# Patient Record
Sex: Male | Born: 1995 | Race: Black or African American | Hispanic: No | Marital: Single | Smoking: Current every day smoker
Health system: Southern US, Community
[De-identification: ages and names within clinical notes are randomized; demographics above are authoritative.]

## PROBLEM LIST (undated history)

## (undated) DIAGNOSIS — Z21 Asymptomatic human immunodeficiency virus [HIV] infection status: Secondary | ICD-10-CM

## (undated) DIAGNOSIS — B20 Human immunodeficiency virus [HIV] disease: Secondary | ICD-10-CM

## (undated) HISTORY — PX: NO PAST SURGERIES: SHX2092

---
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
## 2017-01-09 ENCOUNTER — Ambulatory Visit

## 2017-02-08 ENCOUNTER — Emergency Department (HOSPITAL_COMMUNITY)
Admission: EM | Admit: 2017-02-08 | Discharge: 2017-02-09 | Disposition: A | Discharge status: Home / Self Care | Attending: Emergency Medicine | Admitting: Emergency Medicine

## 2017-02-08 ENCOUNTER — Encounter (HOSPITAL_COMMUNITY): Payer: Self-pay | Admitting: Emergency Medicine

## 2017-02-08 DIAGNOSIS — Y929 Unspecified place or not applicable: Secondary | ICD-10-CM | POA: Insufficient documentation

## 2017-02-08 DIAGNOSIS — F17290 Nicotine dependence, other tobacco product, uncomplicated: Secondary | ICD-10-CM | POA: Diagnosis not present

## 2017-02-08 DIAGNOSIS — Z23 Encounter for immunization: Secondary | ICD-10-CM | POA: Diagnosis not present

## 2017-02-08 DIAGNOSIS — Y999 Unspecified external cause status: Secondary | ICD-10-CM | POA: Diagnosis not present

## 2017-02-08 DIAGNOSIS — S01511A Laceration without foreign body of lip, initial encounter: Secondary | ICD-10-CM | POA: Insufficient documentation

## 2017-02-08 DIAGNOSIS — Y939 Activity, unspecified: Secondary | ICD-10-CM | POA: Insufficient documentation

## 2017-02-08 DIAGNOSIS — R220 Localized swelling, mass and lump, head: Secondary | ICD-10-CM

## 2017-02-08 MED ORDER — TETANUS-DIPHTH-ACELL PERTUSSIS 5-2.5-18.5 LF-MCG/0.5 IM SUSP
0.5000 mL | Freq: Once | INTRAMUSCULAR | Status: AC
Start: 2017-02-09 — End: 2017-02-09
  Administered 2017-02-09: 0.5 mL via INTRAMUSCULAR
  Filled 2017-02-08: qty 0.5
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
## 2017-02-20 ENCOUNTER — Ambulatory Visit (INDEPENDENT_AMBULATORY_CARE_PROVIDER_SITE_OTHER): Admitting: Family Medicine

## 2017-02-20 DIAGNOSIS — B351 Tinea unguium: Secondary | ICD-10-CM

## 2017-02-20 MED ORDER — TERBINAFINE HCL 250 MG PO TABS: 250.0000 mg | ORAL_TABLET | Freq: Every day | ORAL | 0 refills | Status: DC

## 2017-02-20 NOTE — Assessment & Plan Note (Addendum)
Patient is here with signs and symptoms consistent with onychomycoses of the fifth toe bilaterally. No pain at this time however some social detriment noted. - Terbinafine 12 weeks. - I have asked patient to contact his infectious disease doctor to ensure he is okay initiating this medication. Patient stated his understanding. - Patient to follow-up in 2 months if symptoms are not mildly improved.  Next: If patient's infectious disease physician does not approve of 12 week course of terbinafine then strong consideration for topical antifungal.

## 2017-02-20 NOTE — Patient Instructions (Signed)
It was a pleasure seeing you today in our clinic. Today we discussed your toenails. Here is the treatment plan we have discussed and agreed upon together:   The condition is called "onychomycosis" - I prescribed you terbinafine. Take 1 tablet daily for the next 12 weeks. - Be mindful of your footwear prior to your exercises; wearing dry socks and dry shoes are very important. After each workout remove both socks and shoes to allow to air dry. If you are exercising away from home I would strongly recommend bringing a second per shoes or sandals to wear and change into after the workout. - You should notice some mild changes in approximately 2 months; if you notice any worsening come back and see [place].

## 2017-02-20 NOTE — Progress Notes (Signed)
   HPI  CC: Toenail changes  Patient is here to discuss abnormalities in his toenails. He states that the nails of his fifth toes, bilaterally, have been brittle and discolored "since I can remember". He states that neither of these toes are painful. He denies any injury or trauma to these nails. He denies any malodorous scent. He states that these nails are much more brittle than the others. Patient states that although he has no pain or discomfort from these changes, they make him insecure and uncomfortable when exposed to public.  Patient states that he runs for exercise frequently.  ROS: Denies fever, chills, numbness, weakness, or paresthesias.  Objective: BP 106/74   Pulse 72   Temp 98.8 F (37.1 C) (Oral)   Wt 141 lb (64 kg)   BMI 22.76 kg/m  Gen: NAD, alert, cooperative. CV: Well-perfused. Resp: Non-labored. Neuro: Sensation intact throughout. Feet, Bilateral: No evidence of erythema, edema, or bony abnormality. Nailbeds of digit 1 through 4 normal bilaterally. Fifth digit nail bed notably brittle, scalene, hyperpigmented (bilaterally).   Assessment and plan:  Onychomycosis Patient is here with signs and symptoms consistent with onychomycoses of the fifth toe bilaterally. No pain at this time however some social detriment noted. - Terbinafine 12 weeks. - I have asked patient to contact his infectious disease doctor to ensure he is okay initiating this medication. Patient stated his understanding. - Patient to follow-up in 2 months if symptoms are not mildly improved.  Next: If patient's infectious disease physician does not approve of 12 week course of terbinafine then strong consideration for topical antifungal.   Meds ordered this encounter  Medications  . terbinafine (LAMISIL) 250 MG tablet    Sig: Take 1 tablet (250 mg total) by mouth daily.    Dispense:  90 tablet    Refill:  0     Kathee Delton, MD,MS,  PGY3 02/20/2017 5:19 PM

## 2017-05-04 ENCOUNTER — Encounter: Payer: Self-pay | Admitting: Internal Medicine

## 2017-05-09 ENCOUNTER — Telehealth: Payer: Self-pay | Admitting: Pharmacist Clinician (PhC)/ Clinical Pharmacy Specialist

## 2017-05-09 NOTE — Telephone Encounter (Signed)
Called Jeff Daniel to schedule an appt for labs before the visit with Dr. Orvan Falconerampbell in July after email to Dr. Orvan Falconerampbell. He will come in next week.

## 2017-05-11 ENCOUNTER — Ambulatory Visit

## 2017-05-15 ENCOUNTER — Other Ambulatory Visit

## 2017-05-15 DIAGNOSIS — B20 Human immunodeficiency virus [HIV] disease: Secondary | ICD-10-CM

## 2017-05-15 LAB — CBC
HCT: 43.9 % (ref 36.0–49.0)
Hemoglobin: 14.7 g/dL (ref 12.0–16.9)
MCH: 31.2 pg (ref 25.0–35.0)
MCHC: 33.5 g/dL (ref 31.0–36.0)
MCV: 93.2 fL (ref 78.0–98.0)
MPV: 9.9 fL (ref 7.5–12.5)
Platelets: 265 10*3/uL (ref 140–400)
RBC: 4.71 MIL/uL (ref 4.10–5.70)
RDW: 12.7 % (ref 11.0–15.0)
WBC: 6 10*3/uL (ref 4.5–13.0)

## 2017-05-15 LAB — COMPREHENSIVE METABOLIC PANEL
ALT: 29 U/L (ref 8–46)
AST: 20 U/L (ref 12–32)
Albumin: 4.1 g/dL (ref 3.6–5.1)
Alkaline Phosphatase: 78 U/L (ref 48–230)
BUN: 6 mg/dL — AB (ref 7–20)
CHLORIDE: 104 mmol/L (ref 98–110)
CO2: 26 mmol/L (ref 20–31)
CREATININE: 0.86 mg/dL (ref 0.60–1.26)
Calcium: 9.2 mg/dL (ref 8.9–10.4)
Glucose, Bld: 89 mg/dL (ref 65–99)
Potassium: 4.1 mmol/L (ref 3.8–5.1)
SODIUM: 140 mmol/L (ref 135–146)
Total Bilirubin: 0.5 mg/dL (ref 0.2–1.1)
Total Protein: 6.9 g/dL (ref 6.3–8.2)

## 2017-05-16 LAB — T-HELPER CELL (CD4) - (RCID CLINIC ONLY)
CD4 % Helper T Cell: 28 % — ABNORMAL LOW (ref 33–55)
CD4 T Cell Abs: 750 /uL (ref 400–2700)

## 2017-05-17 LAB — HIV-1 RNA QUANT-NO REFLEX-BLD
HIV 1 RNA Quant: <20 copies/mL — AB
HIV-1 RNA QUANT, LOG: DETECTED {Log_copies}/mL — AB

## 2017-05-24 ENCOUNTER — Ambulatory Visit (INDEPENDENT_AMBULATORY_CARE_PROVIDER_SITE_OTHER): Admitting: Internal Medicine

## 2017-05-24 DIAGNOSIS — B20 Human immunodeficiency virus [HIV] disease: Secondary | ICD-10-CM

## 2017-05-24 NOTE — Progress Notes (Signed)
Patient Active Problem List   Diagnosis Date Noted  . 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 HIV (human immunodeficiency virus infection) (HCC) 05/04/2016    Priority: High  . Depression 07/12/2016    Priority: Medium  . Outbursts of anger 07/12/2016    Priority: Medium  . Onychomycosis 02/20/2017  . Nephrolithiasis 08/22/2016  . Constipation 05/04/2016  . Chronic cough 04/29/2016  . High risk sexual behavior 04/28/2016  . Seasonal allergies 03/25/2015    Patient's Medications  New Prescriptions   No medications on file  Previous Medications   ABACAVIR-DOLUTEGRAVIR-LAMIVUDINE (TRIUMEQ) 600-50-300 MG TABLET    Take 1 tablet by mouth daily.   TERBINAFINE (LAMISIL) 250 MG TABLET    Take 1 tablet (250 mg total) by mouth daily.  Modified Medications   No medications on file  Discontinued Medications   No medications on file    Subjective: Jeff Daniel is in for his routine HIV follow-up visit. He has had no problems obtaining, taking or tolerating his Triumeq. He takes it each day around noon. He estimates that he has missed 2 doses in the past month when he was not home to take it. He is currently working at [name] but will return to school in the fall. He is currently not in a relationship and not sexually active.  Review of Systems: Review of Systems  Constitutional: Negative for chills, diaphoresis, fever, malaise/fatigue and weight loss.  HENT: Negative for sore throat.   Respiratory: Negative for cough, sputum production and shortness of breath.   Cardiovascular: Negative for chest pain.  Gastrointestinal: Negative for abdominal pain, diarrhea, heartburn, nausea and vomiting.  Genitourinary: Negative for dysuria and frequency.  Musculoskeletal: Negative for joint pain and myalgias.  Skin: Negative for rash.  Neurological: Negative for dizziness and headaches.  Psychiatric/Behavioral: Negative for depression and substance  abuse. The patient is not nervous/anxious.     Past Medical History:  Diagnosis Date  . HIV (human immunodeficiency virus infection) (HCC)     Social History  Substance Use Topics  . Smoking status: Current Every Day Smoker    Types: Cigars  . Smokeless tobacco: Never Used  . Alcohol use Yes    Family History  Problem Relation Age of Onset  . Hypertension Mother   . Hypertension Maternal Grandmother   . Diabetes Maternal Grandmother   . Hypertension Maternal Grandfather   . Diabetes Maternal Grandfather   . Hypertension Paternal Grandmother   . Diabetes Paternal Grandmother   . Hypertension Paternal Grandfather   . Diabetes Paternal Grandfather     No Known Allergies  Objective:  Vitals:   05/24/17 1549  BP: 121/71  Pulse: (!) 53  Temp: 98 F (36.7 C)  SpO2: 99%   There is no height or weight on file to calculate BMI.  Physical Exam  Constitutional: He is oriented to person, place, and time.  HENT:  Mouth/Throat: No oropharyngeal exudate.  Eyes: Conjunctivae are normal.  Cardiovascular: Normal rate and regular rhythm.   No murmur heard. Pulmonary/Chest: Effort normal and breath sounds normal.  Abdominal: Soft. He exhibits no mass. There is no tenderness.  Musculoskeletal: Normal range of motion.  Neurological: He is alert and oriented to person, place, and time.  Skin: No rash noted.  Psychiatric: Mood and affect normal.    Lab Results Lab Results  Component Value Date   WBC 6.0 05/15/2017   HGB 14.7 05/15/2017  HCT 43.9 05/15/2017   MCV 93.2 05/15/2017   PLT 265 05/15/2017    Lab Results  Component Value Date   CREATININE 0.86 05/15/2017   BUN 6 (L) 05/15/2017   NA 140 05/15/2017   K 4.1 05/15/2017   CL 104 05/15/2017   CO2 26 05/15/2017    Lab Results  Component Value Date   ALT 29 05/15/2017   AST 20 05/15/2017   ALKPHOS 78 05/15/2017   BILITOT 0.5 05/15/2017    Lab Results  Component Value Date   CHOL 124 12/22/2016   HDL 58  12/22/2016   LDLCALC 57 12/22/2016   TRIG 45 12/22/2016   CHOLHDL 2.1 12/22/2016   Lab Results  Component Value Date   LABRPR NON REAC 12/22/2016   HIV 1 RNA Quant (copies/mL)  Date Value  05/15/2017 <20 DETECTED (A)  12/22/2016 59 (H)  07/12/2016 137 (H)   CD4 T Cell Abs (/uL)  Date Value  05/15/2017 750  12/22/2016 520  07/12/2016 420     Problem List Items Addressed This Visit      High   HIV (human immunodeficiency virus infection) (HCC)    His infection has come under excellent control over the past year. I talked to him about adherence tips and gave him a small pill canisters to use. He will continue Triumeq and follow-up here after blood work in 6 months.      Relevant Orders   T-helper cell (CD4)- ([facility] clinic only)   HIV 1 RNA quant-no reflex-bld   CBC   Comprehensive metabolic panel   Lipid panel   RPR        [name], MD [facility] [phone] pager   [phone] cell 05/24/2017, 3:59 PM

## 2017-05-24 NOTE — Assessment & Plan Note (Signed)
His infection has come under excellent control over the past year. I talked to him about adherence tips and gave him a small pill canisters to use. He will continue Triumeq and follow-up here after blood work in 6 months.

## 2017-06-19 ENCOUNTER — Other Ambulatory Visit: Payer: Self-pay | Admitting: Internal Medicine

## 2017-06-19 DIAGNOSIS — B20 Human immunodeficiency virus [HIV] disease: Secondary | ICD-10-CM

## 2017-08-07 IMAGING — CR DG CHEST 2V
2 series · 2 of 2 positions shown · non-contrast
Comparison: None.

CLINICAL DATA: Productive cough for 2 months, fever.

EXAM:
CHEST  2 VIEW

[w chest pa]
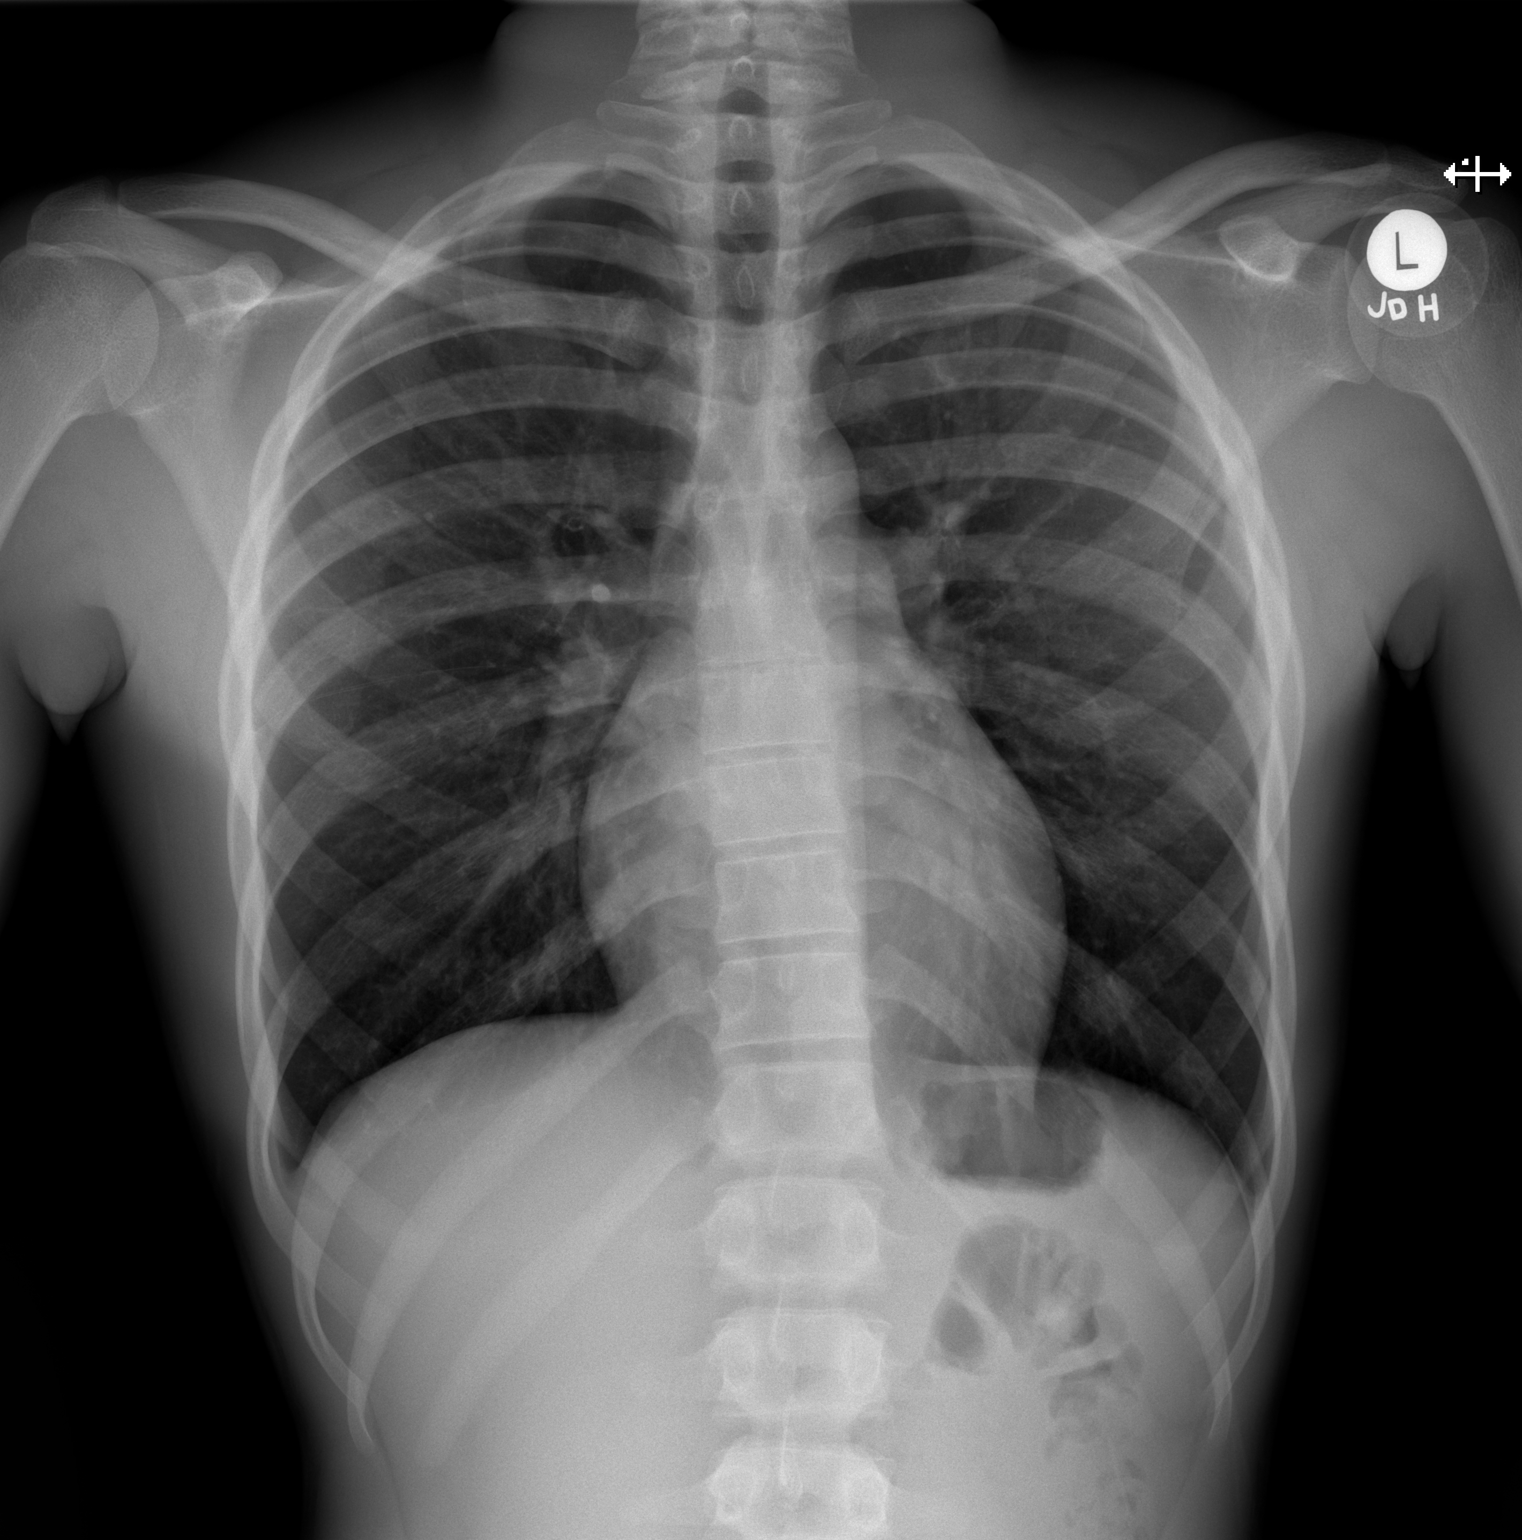

[w chest lat]
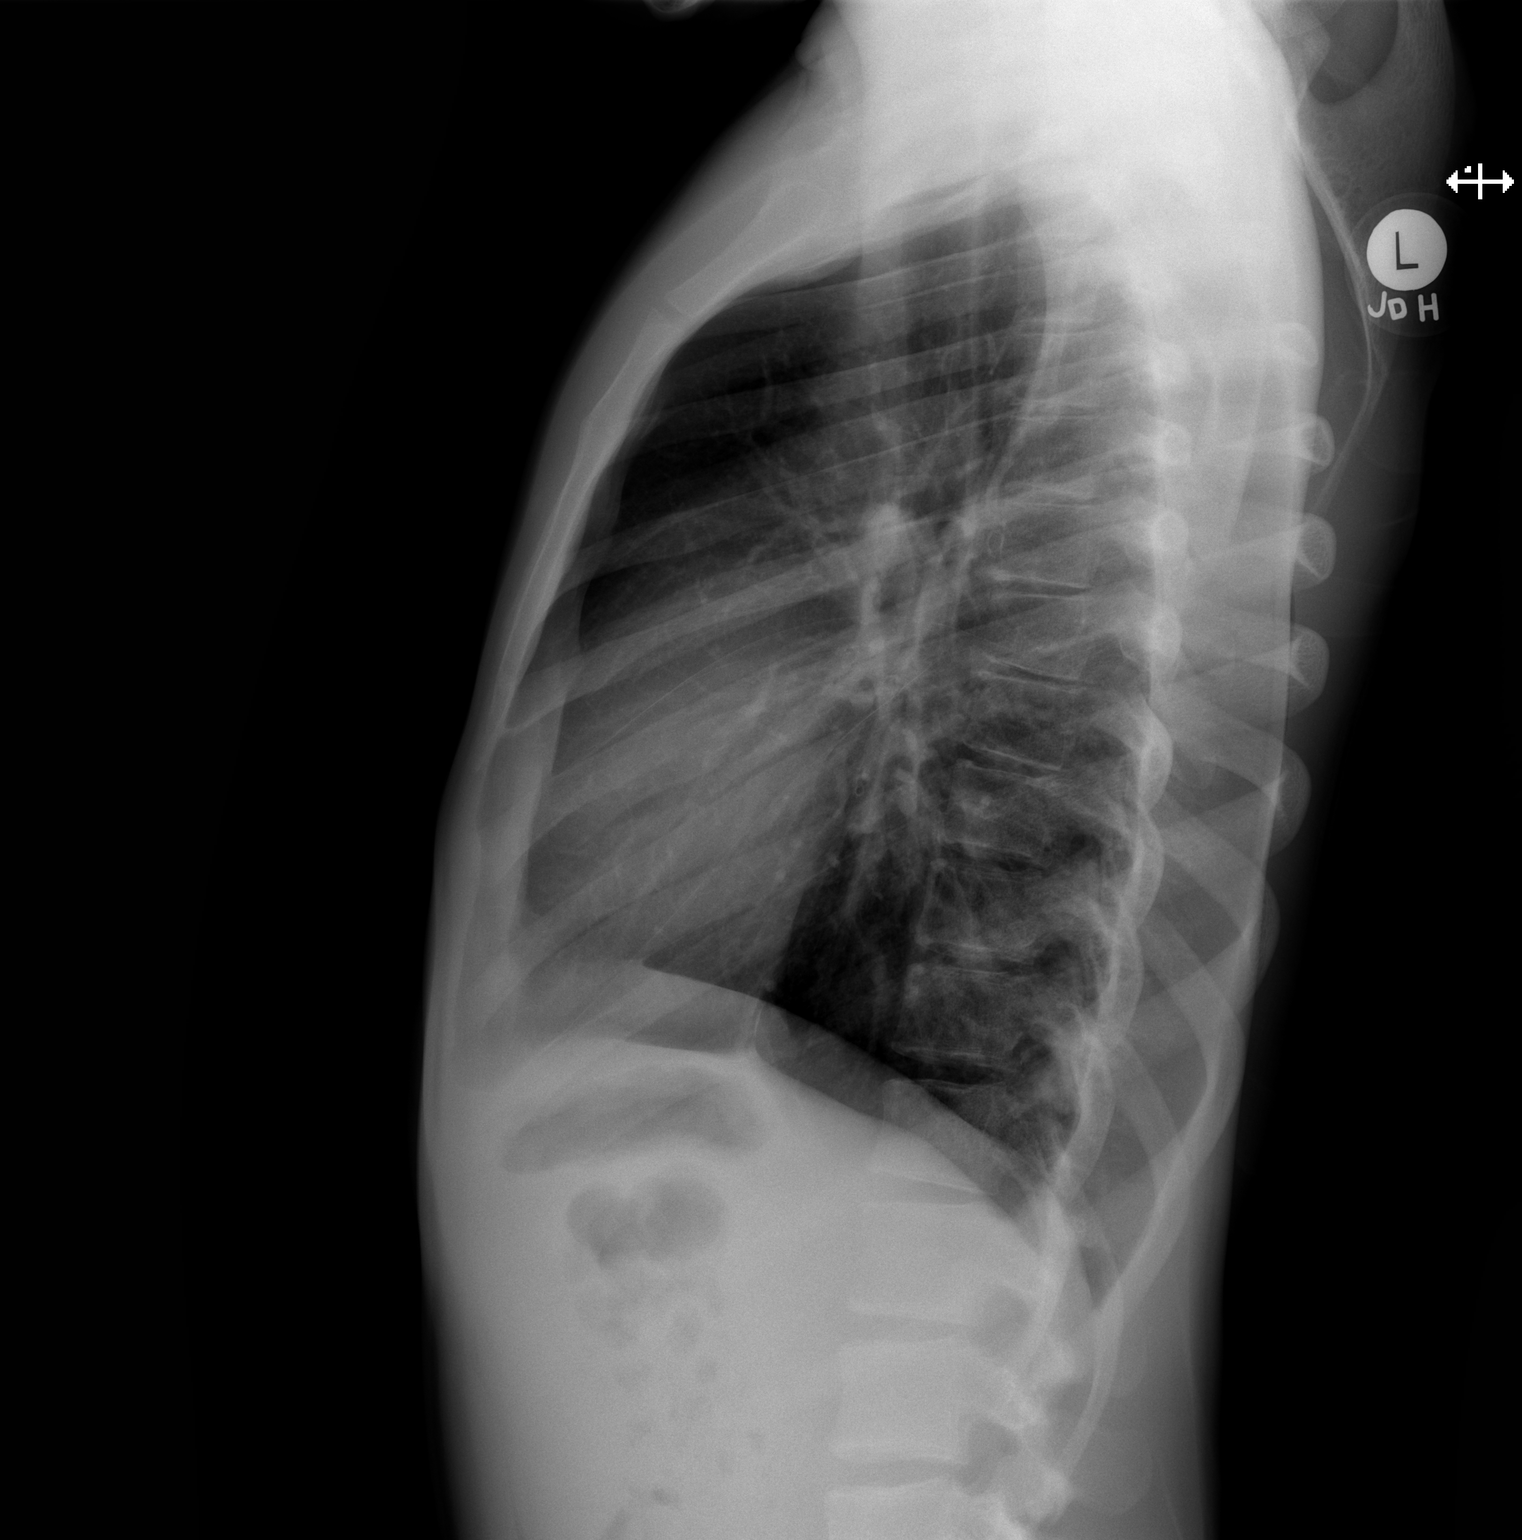

[2 of 2 positions shown; findings below may reference images not displayed]

FINDINGS: The heart size and mediastinal contours are within normal limits.
Both lungs are clear. The visualized skeletal structures are
unremarkable.
IMPRESSION: No active cardiopulmonary disease.

## 2017-09-15 ENCOUNTER — Ambulatory Visit (INDEPENDENT_AMBULATORY_CARE_PROVIDER_SITE_OTHER): Payer: Self-pay | Admitting: Family Medicine

## 2017-09-15 VITALS — BP 102/60 | HR 72 | Temp 97.9°F | Wt 143.0 lb

## 2017-09-15 DIAGNOSIS — B351 Tinea unguium: Secondary | ICD-10-CM

## 2017-09-15 NOTE — Patient Instructions (Signed)
Fungal Nail Infection Fungal nail infection is a common fungal infection of the toenails or fingernails. This condition affects toenails more often than fingernails. More than one nail may be infected. The condition can be passed from person to person (is contagious). What are the causes? This condition is caused by a fungus. Several types of funguses can cause the infection. These funguses are common in moist and warm areas. If your hands or feet come into contact with the fungus, it may get into a crack in your fingernail or toenail and cause the infection. What increases the risk? The following factors may make you more likely to develop this condition:  Being male.  Having diabetes.  Being of older age.  Living with someone who has the fungus.  Walking barefoot in areas where the fungus thrives, such as showers or locker rooms.  Having poor circulation.  Wearing shoes and socks that cause your feet to sweat.  Having athlete's foot.  Having a nail injury or history of a recent nail surgery.  Having psoriasis.  Having a weak body defense system (immune system).  What are the signs or symptoms? Symptoms of this condition include:  A pale spot on the nail.  Thickening of the nail.  A nail that becomes yellow or brown.  A brittle or ragged nail edge.  A crumbling nail.  A nail that has lifted away from the nail bed.  How is this diagnosed? This condition is diagnosed with a physical exam. Your health care provider may take a scraping or clipping from your nail to test for the fungus. How is this treated? Mild infections do not need treatment. If you have significant nail changes, treatment may include:  Oral antifungal medicines. You may need to take the medicine for several weeks or several months, and you may not see the results for a long time. These medicines can cause side effects. Ask your health care provider what problems to watch for.  Antifungal nail polish  and nail cream. These may be used along with oral antifungal medicines.  Laser treatment of the nail.  Surgery to remove the nail. This may be needed for the most severe infections.  Treatment takes a long time, and the infection may come back. Follow these instructions at home: Medicines  Take or apply over-the-counter and prescription medicines only as told by your health care provider.  Ask your health care provider about using over-the-counter mentholated ointment on your nails. Lifestyle   Do not share personal items, such as towels or nail clippers.  Trim your nails often.  Wash and dry your hands and feet every day.  Wear absorbent socks, and change your socks frequently.  Wear shoes that allow air to circulate, such as sandals or canvas tennis shoes. Throw out old shoes.  Wear rubber gloves if you are working with your hands in wet areas.  Do not walk barefoot in shower rooms or locker rooms.  Do not use a nail salon that does not use clean instruments.  Do not use artificial nails. General instructions  Keep all follow-up visits as told by your health care provider. This is important.  Use antifungal foot powder on your feet and in your shoes. Contact a health care provider if: Your infection is not getting better or it is getting worse after several months. This information is not intended to replace advice given to you by your health care provider. Make sure you discuss any questions you have with your health   care provider. Document Released: 10/14/2000 Document Revised: 03/24/2016 Document Reviewed: 04/20/2015 Elsevier Interactive Patient Education  [employer]2018 Elsevier Inc[employer]   It was a pleasure meeting you today.   Today we discussed your fungal nail infection.  For your infection: I would like to check your liver function labs before prescribing you medicine. I will check them today and if the results are normal I will call in a prescription for terbinafine to  take for 12 weeks. For the big toe you may use over the counter Lotrimin cream at the top of your nail as well.   If I start you on Terbinafine you will need to come back in 1 month for repeat labs to make sure your liver function stays normal.   Please follow up in 1 month for labs and as needed for follow up of the toe or sooner if symptoms persist or worsen. Please call the clinic immediately if you have concerns.   Our clinic's number is [phone]. Please call with questions or concerns.   Thank you,  Oralia ManisSherin Cydney Alvarenga, DO

## 2017-09-15 NOTE — Assessment & Plan Note (Signed)
Patient today with signs and symptoms consistent with onychomycosis of fifth toe bilaterally and of first toe on right food. No pain, itching, or odor noted.  -will obtain LFTs today to determine liver function. If normal, will prescribe terbinafine x 12 weeks. Will obtain repeat LFT's 1 month after initiation of terbinafine  -may use OTC lotrimin to distal portion of nail on first digit of 5th toe -follow up as needed if symptoms do not improve or worsen and for repeat labs

## 2017-09-15 NOTE — Progress Notes (Signed)
   Subjective:    Patient ID: Jeff Daniel, male    DOB: 1996-05-15, 21 y.o.   MRN: [id number]   CC: toe fungus   HPI: Toe fungus  Patient today with complaints of toe fungus for "as long as I can remember". States he was seen in clinic in April for this problem and was prescribed terbinafine x12 weeks. Toenails improved but never fully cleared up. Patient denies pain, odor, or itching. States toenails are not bothering him but does not like the appearance. Patient notices nails are "chipping" and are very hard. 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 Patient has a history of HIV but is taking his medications daily and is being followed by ID.   Objective:  BP 102/60   Pulse 72   Temp 97.9 F (36.6 C) (Oral)   Wt 143 lb (64.9 kg)   SpO2 98%   BMI 23.08 kg/m  Vitals and nursing note reviewed  General: well nourished, in no acute distress Cardiac: RRR, clear S1 and S2, no murmurs, rubs, or gallops Respiratory: clear to auscultation bilaterally, no increased work of breathing Abdomen: soft, nontender, nondistended, no masses or organomegaly. Bowel sounds present Extremities: no edema or cyanosis. Warm, well perfused. 2+ radial and PT pulses bilaterally Feet: no erythema, edema, or bony abnormality, well perfused. Nailbeds of digit 5 of right foot and digit 5 of left foot notably brittle, scalene, and hyperpigmented. Distal edge of nailbed of digit 1 on right foot showing increased scaling.  Skin: warm and dry, no rashes noted Neuro: alert and oriented, no focal deficits  Assessment & Plan:   Onychomycosis Patient today with signs and symptoms consistent with onychomycosis of fifth toe bilaterally and of first toe on right food. No pain, itching, or odor noted.  -will obtain LFTs today to determine liver function. If normal, will prescribe terbinafine x 12 weeks. Will obtain repeat LFT's 1 month after initiation of terbinafine  -may  use OTC lotrimin to distal portion of nail on first digit of 5th toe -follow up as needed if symptoms do not improve or worsen and for repeat labs   Return if symptoms worsen or fail to improve.   Oralia ManisSherin Ethelbert Thain, DO, PGY-1

## 2017-09-16 LAB — HEPATIC FUNCTION PANEL
ALBUMIN: 4.4 g/dL (ref 3.5–5.5)
ALT: 37 IU/L (ref 0–44)
AST: 81 IU/L — AB (ref 0–40)
Alkaline Phosphatase: 73 IU/L (ref 39–117)
BILIRUBIN TOTAL: 0.3 mg/dL (ref 0.0–1.2)
Bilirubin, Direct: 0.1 mg/dL (ref 0.00–0.40)
TOTAL PROTEIN: 7.2 g/dL (ref 6.0–8.5)

## 2017-09-18 ENCOUNTER — Telehealth: Payer: Self-pay | Admitting: Family Medicine

## 2017-09-18 NOTE — Telephone Encounter (Signed)
Patient with elevated AST of 81. Previous AST levels wnl. Discussed results with patient, no concerns at this time. Informed patient to call if questions arise. Recommend that patient obtain fungal culture and to tailor treatment based on culture results. Will not prescribe terbinafine at this time given elevated AST.  -patient verbalized understanding of plan and stated he will make follow up appointment to obtain fungal culture -discussed plan with preceptor, Dr. Clista BernhardtEniola   Koda Routon, DO, PGY-1 [facility] 09/18/2017 11:38 AM

## 2017-10-06 ENCOUNTER — Ambulatory Visit: Payer: Self-pay | Admitting: Family Medicine

## 2017-11-05 IMAGING — CT CT RENAL STONE PROTOCOL
2 of 4 series · 16 of 46 positions shown, 18 images · non-contrast
Comparison: None.

CLINICAL DATA: Right flank pain since midnight last night.

EXAM:
CT ABDOMEN AND PELVIS WITHOUT CONTRAST
TECHNIQUE: Multidetector CT imaging of the abdomen and pelvis was performed
following the standard protocol without IV contrast.

[Series 2: renal stone 5mm · axial · 0.65mm/px · z∈[+830,+1210]mm · 13 of 84 slices shown, 15 images]
[im 4/84  soft-tissue]
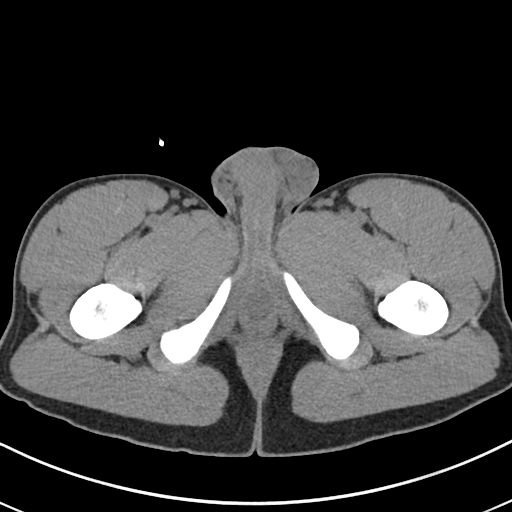
[im 4/84  bone]
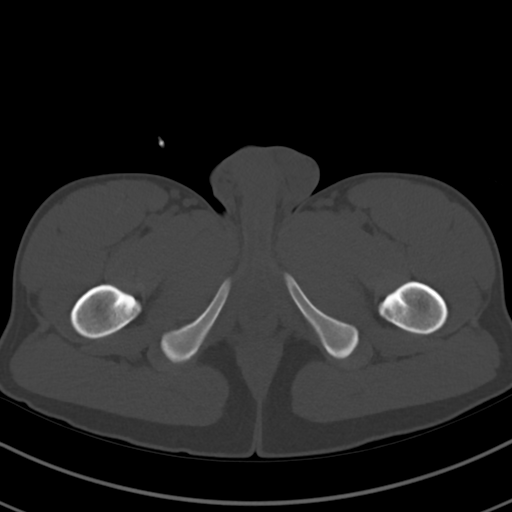
[im 10/84  soft-tissue]
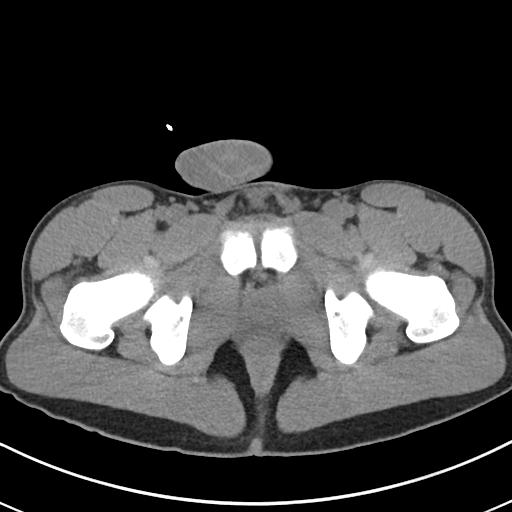
[im 16/84  soft-tissue]
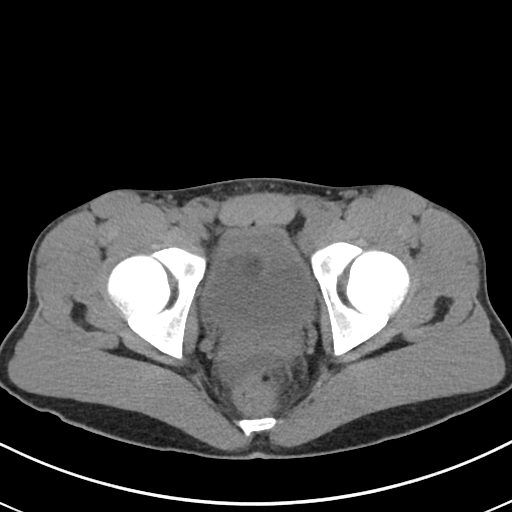
[im 23/84  soft-tissue]
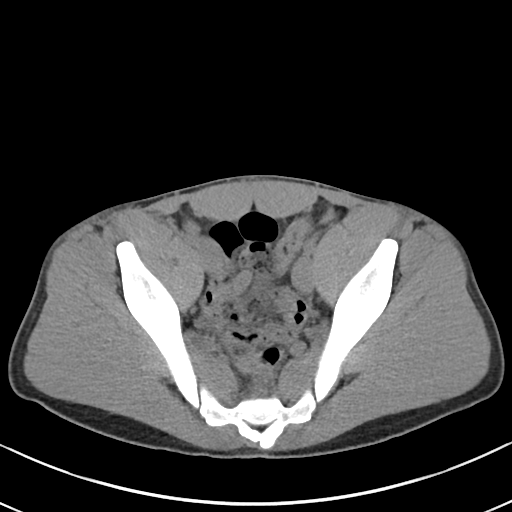
[im 29/84  soft-tissue]
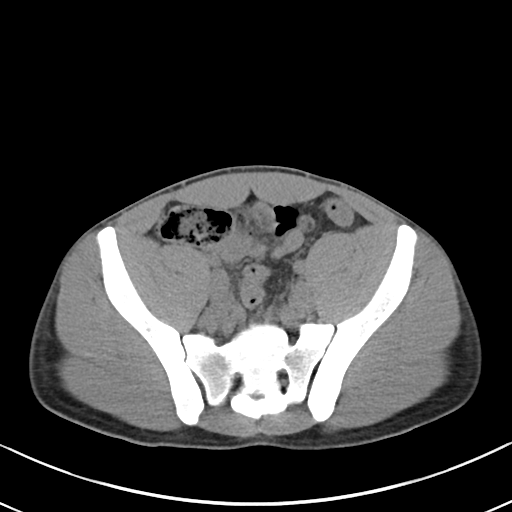
[im 36/84  soft-tissue]
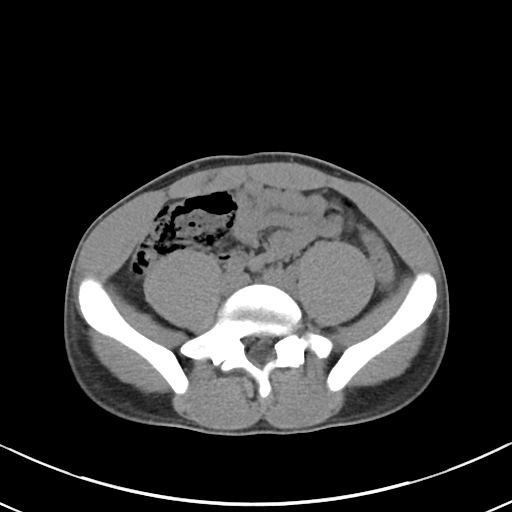
[im 42/84  soft-tissue]
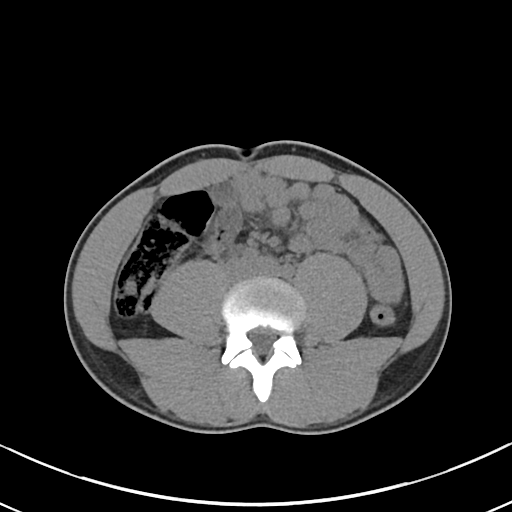
[im 48/84  soft-tissue]
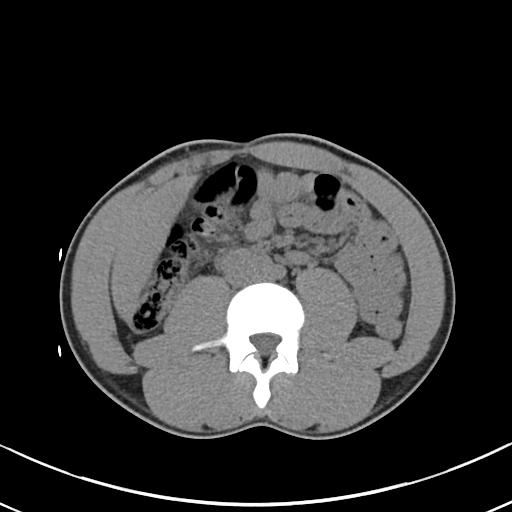
[im 55/84  soft-tissue]
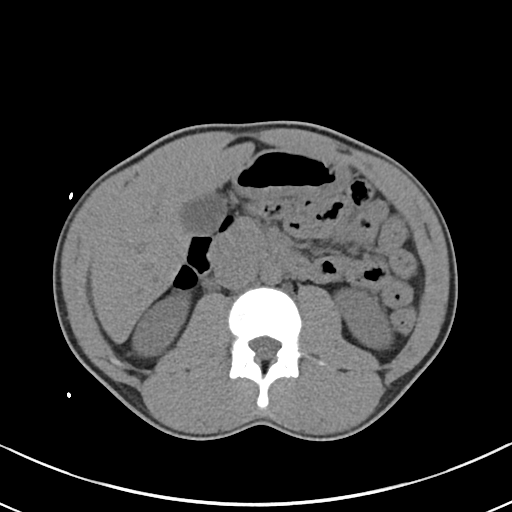
[im 55/84  bone]
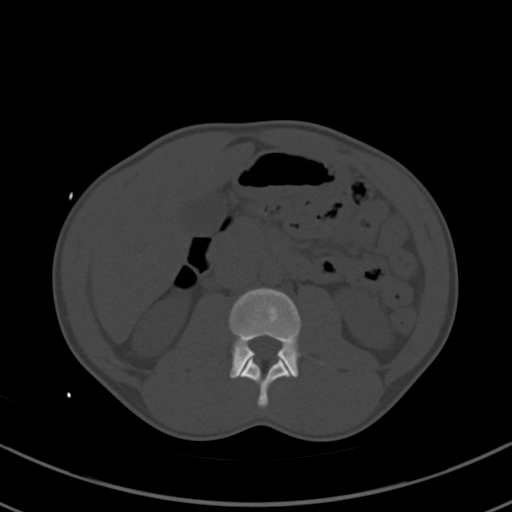
[im 61/84  soft-tissue]
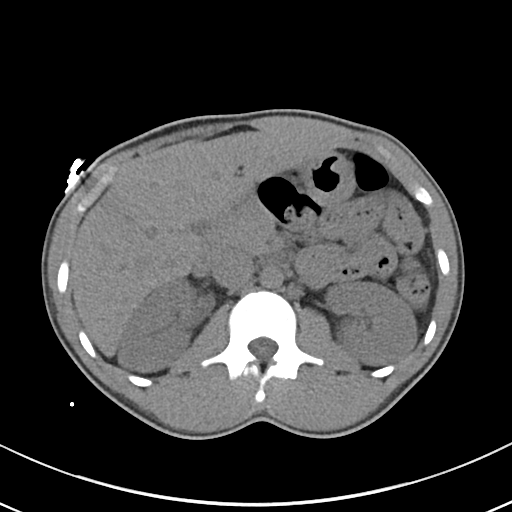
[im 68/84  soft-tissue]
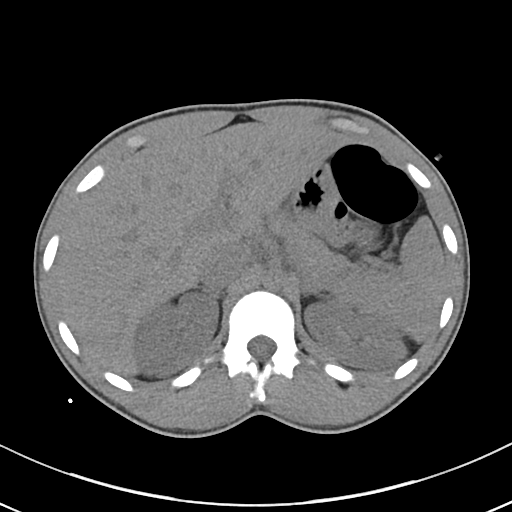
[im 74/84  soft-tissue]
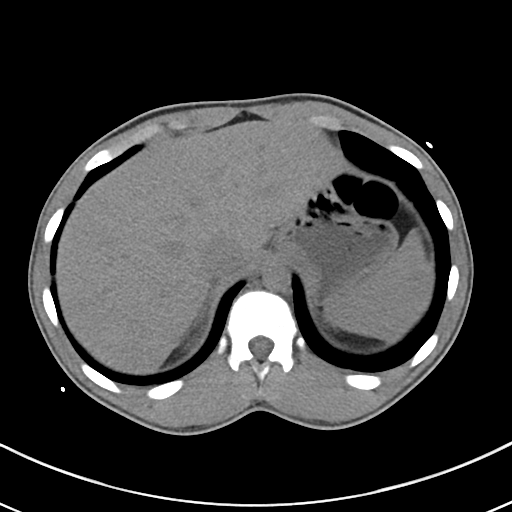
[im 80/84  soft-tissue]
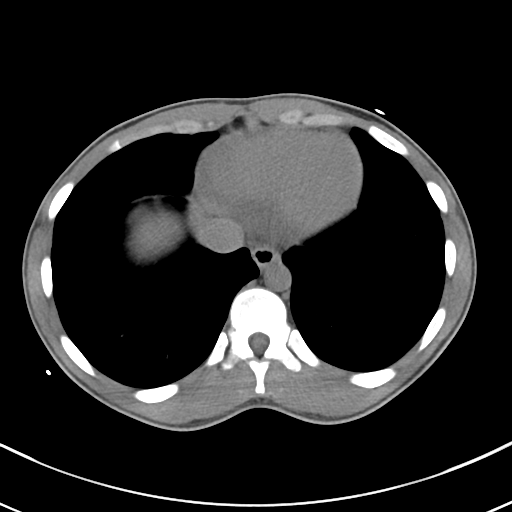

[Series 5: renal stone 3.0 cor · coronal · 0.64mm/px · 3 of 75 slices shown]
[im 25/75  soft-tissue]
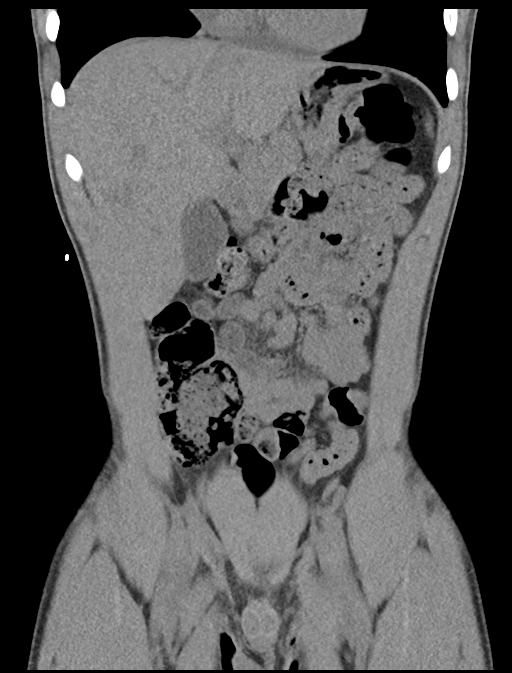
[im 33/75  soft-tissue]
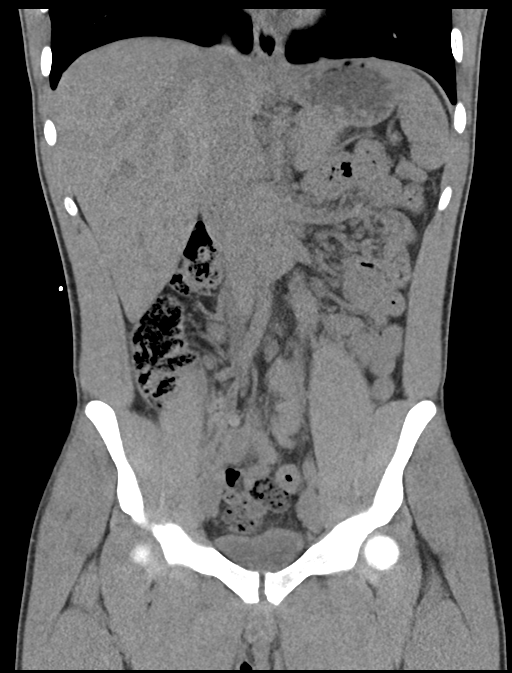
[im 42/75  soft-tissue]
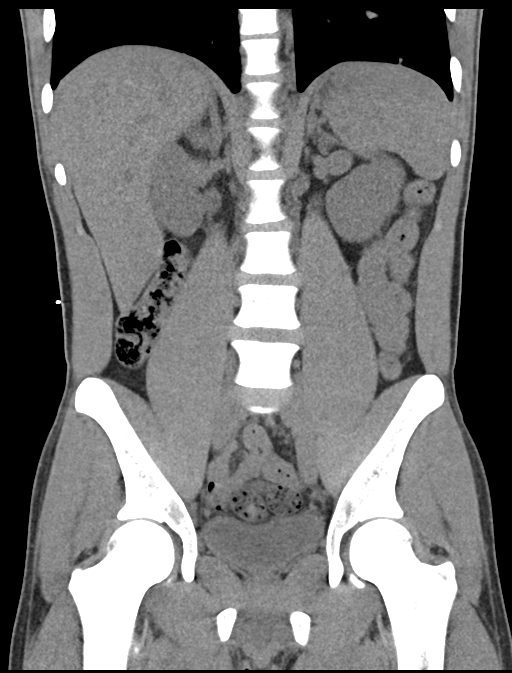

[16 of 46 positions shown; findings below may reference images not displayed]

FINDINGS: Lower chest: The lung bases are clear of acute process. No pleural
effusion or pulmonary lesions. The heart is normal in size. No
pericardial effusion. The distal esophagus and aorta are
unremarkable.

Hepatobiliary: No focal hepatic lesions or intrahepatic biliary
dilatation. The gallbladder is normal. No common bile duct
dilatation.

Pancreas: No mass, inflammation or ductal dilatation.

Spleen: Normal size.  No focal lesions.

Adrenals/Urinary Tract: The adrenal glands are normal.

Mild right-sided hydroureteronephrosis with a 1-2 mm calculus at the
right UVJ. No left-sided ureteral calculi. No bladder calculi.

Small lower pole right renal calculus is noted. The medullary
pyramids appear slightly dense and I suspect the patient has early
changes of medullary nephrocalcinosis, likely medullary sponge
kidney. No worrisome renal lesions. No bladder lesion.

Stomach/Bowel: The stomach, duodenum, small bowel and colon are
grossly normal without oral contrast. No inflammatory changes, mass
lesions or obstructive findings. The appendix is normal.

Vascular/Lymphatic: No significant vascular findings are present. No
enlarged abdominal or pelvic lymph nodes.

Reproductive: The prostate gland and seminal vesicles are
unremarkable.

Other: Small amount of free pelvic fluid noted. No inguinal mass,
adenopathy or hernia.

Musculoskeletal: No significant bony findings.
IMPRESSION: 1. 1-2 mm calculus at the right UVJ causing mild to moderate
right-sided hydroureteronephrosis.
2. Lower pole right renal calculus and suspect early changes of
medullary nephrocalcinosis.
3. No other significant abdominal/pelvic findings.

## 2017-11-27 ENCOUNTER — Ambulatory Visit (INDEPENDENT_AMBULATORY_CARE_PROVIDER_SITE_OTHER): Payer: Self-pay | Admitting: Licensed Clinical Social Worker

## 2017-11-27 ENCOUNTER — Ambulatory Visit (INDEPENDENT_AMBULATORY_CARE_PROVIDER_SITE_OTHER): Payer: Self-pay | Admitting: Internal Medicine

## 2017-11-27 ENCOUNTER — Encounter: Payer: Self-pay | Admitting: Internal Medicine

## 2017-11-27 DIAGNOSIS — B20 Human immunodeficiency virus [HIV] disease: Secondary | ICD-10-CM

## 2017-11-27 DIAGNOSIS — F331 Major depressive disorder, recurrent, moderate: Secondary | ICD-10-CM

## 2017-11-27 DIAGNOSIS — F17200 Nicotine dependence, unspecified, uncomplicated: Secondary | ICD-10-CM

## 2017-11-27 DIAGNOSIS — F321 Major depressive disorder, single episode, moderate: Secondary | ICD-10-CM

## 2017-11-27 MED ORDER — PAROXETINE HCL 20 MG PO TABS: 20.0000 mg | ORAL_TABLET | Freq: Every day | ORAL | 11 refills | Status: DC

## 2017-11-27 NOTE — Assessment & Plan Note (Addendum)
I had him meet again with our counselor, Vergia AlbertsSherry Royster, today.  We will follow-up in 6 weeks.  I will start him on paroxetine 20 mg daily.

## 2017-11-27 NOTE — Assessment & Plan Note (Signed)
I encouraged him to consider quitting. 

## 2017-11-27 NOTE — Addendum Note (Signed)
Addended by: Cliffton AstersAMPBELL, Oshen Wlodarczyk on: 11/27/2017 03:03 PM   Modules accepted: Orders

## 2017-11-27 NOTE — Progress Notes (Addendum)
Patient Active Problem List   Diagnosis Date Noted  . 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 HIV (human immunodeficiency virus infection) (HCC) 05/04/2016    Priority: High  . Depression 07/12/2016    Priority: Medium  . Outbursts of anger 07/12/2016    Priority: Medium  . Current smoker 11/27/2017  . Onychomycosis 02/20/2017  . Nephrolithiasis 08/22/2016  . Constipation 05/04/2016  . Chronic cough 04/29/2016  . High risk sexual behavior 04/28/2016  . Seasonal allergies 03/25/2015    Patient's Medications  New Prescriptions   PAROXETINE (PAXIL) 20 MG TABLET    Take 1 tablet (20 mg total) by mouth daily.  Previous Medications   TERBINAFINE (LAMISIL) 250 MG TABLET    Take 1 tablet (250 mg total) by mouth daily.   TRIUMEQ 600-50-300 MG TABLET    TAKE 1 TABLET BY MOUTH DAILY  Modified Medications   No medications on file  Discontinued Medications   No medications on file    Subjective: Jeff Daniel is in for his routine HIV follow-up visit.  He is currently back in school studying economics with an emphasis on business.  He has about a year and a half of schoolwork left before he graduates.  He has been struggling with depression recently that is worse than usual.  He recently moved back home with his family which he believes has helped.  However, he says that on weekends he simply shuts down.  His sleep schedule is very erratic.  He smokes marijuana, primarily on weekends and feels.  He is currently in a relationship with a male partner and they are sexually active but uses condoms regularly.  He has had no problems obtaining, taking or tolerating his Triumeq.  He does not recall missing doses.  He takes it each day about noon.  He continues to smoke cigarettes and has no current interest in quitting.  Review of Systems: Review of Systems  Constitutional: Positive for malaise/fatigue. Negative for chills, diaphoresis, fever and weight loss.  HENT: Negative for  sore throat.   Respiratory: Negative for cough, sputum production and shortness of breath.   Cardiovascular: Negative for chest pain.  Gastrointestinal: Negative for abdominal pain, diarrhea, heartburn, nausea and vomiting.  Genitourinary: Negative for dysuria and frequency.  Musculoskeletal: Negative for joint pain and myalgias.  Skin: Negative for rash.  Neurological: Negative for dizziness and headaches.  Psychiatric/Behavioral: Positive for depression. Negative for substance abuse. The patient has insomnia. The patient is not nervous/anxious.     Past Medical History:  Diagnosis Date  . HIV (human immunodeficiency virus infection) (HCC)     Social History   Tobacco Use  . Smoking status: Current Every Day Smoker    Types: Cigars  . Smokeless tobacco: Never Used  Substance Use Topics  . Alcohol use: Yes  . Drug use: Yes    Types: Marijuana    Family History  Problem Relation Age of Onset  . Hypertension Mother   . Hypertension Maternal Grandmother   . Diabetes Maternal Grandmother   . Hypertension Maternal Grandfather   . Diabetes Maternal Grandfather   . Hypertension Paternal Grandmother   . Diabetes Paternal Grandmother   . Hypertension Paternal Grandfather   . Diabetes Paternal Grandfather     No Known Allergies  Health Maintenance  Topic Date Due  . TETANUS/TDAP  02/10/2027  . INFLUENZA VACCINE  Completed    Objective:  Vitals:   11/27/17 1349  BP:  100/62  Pulse: (!) 53  Temp: 98.3 F (36.8 C)  TempSrc: Oral  Weight: 141 lb (64 kg)  Height: 5\' 6"  (1.676 m)   Body mass index is 22.76 kg/m.  Physical Exam  Constitutional: He is oriented to person, place, and time.  He is pleasant but seems somewhat concerned.  HENT:  Mouth/Throat: No oropharyngeal exudate.  Eyes: Conjunctivae are normal.  Cardiovascular: Normal rate and regular rhythm.  No murmur heard. Pulmonary/Chest: Effort normal and breath sounds normal.  Abdominal: Soft. He exhibits  no mass. There is no tenderness.  Musculoskeletal: Normal range of motion.  Neurological: He is alert and oriented to person, place, and time.  Skin: No rash noted.  Psychiatric: Mood and affect normal.    Lab Results Lab Results  Component Value Date   WBC 6.0 05/15/2017   HGB 14.7 05/15/2017   HCT 43.9 05/15/2017   MCV 93.2 05/15/2017   PLT 265 05/15/2017    Lab Results  Component Value Date   CREATININE 0.86 05/15/2017   BUN 6 (L) 05/15/2017   NA 140 05/15/2017   K 4.1 05/15/2017   CL 104 05/15/2017   CO2 26 05/15/2017    Lab Results  Component Value Date   ALT 37 09/15/2017   AST 81 (H) 09/15/2017   ALKPHOS 73 09/15/2017   BILITOT 0.3 09/15/2017    Lab Results  Component Value Date   CHOL 124 12/22/2016   HDL 58 12/22/2016   LDLCALC 57 12/22/2016   TRIG 45 12/22/2016   CHOLHDL 2.1 12/22/2016   Lab Results  Component Value Date   LABRPR NON REAC 12/22/2016   HIV 1 RNA Quant (copies/mL)  Date Value  05/15/2017 <20 DETECTED (A)  12/22/2016 59 (H)  07/12/2016 137 (H)   CD4 T Cell Abs (/uL)  Date Value  05/15/2017 750  12/22/2016 520  07/12/2016 420     Problem List Items Addressed This Visit      High   HIV (human immunodeficiency virus infection) (HCC)    His adherence is very good and his infection has been under good control.  He will continue Triumeq and get repeat lab work today.  He will follow-up in 6 weeks.      Relevant Orders   T-helper cell (CD4)- ([facility] clinic only)   HIV 1 RNA quant-no reflex-bld   CBC   Comprehensive metabolic panel   RPR   Lipid panel     Medium   Depression    I had him meet again with our counselor, Vergia Alberts, today.  We will follow-up in 6 weeks.  I will start him on paroxetine 20 mg daily.      Relevant Medications   PARoxetine (PAXIL) 20 MG tablet     Unprioritized   Current smoker    I encouraged him to consider quitting.           Cliffton Asters, MD [facility] [phone] pager   [phone] cell 11/27/2017, 3:05 PM

## 2017-11-27 NOTE — Assessment & Plan Note (Signed)
His adherence is very good and his infection has been under good control.  He will continue Triumeq and get repeat lab work today.  He will follow-up in 6 weeks.

## 2017-11-28 LAB — LIPID PANEL
CHOLESTEROL: 124 mg/dL (ref <170)
HDL: 56 mg/dL (ref >45)
LDL Cholesterol (Calc): 54 mg/dL (calc) (ref <110)
Non-HDL Cholesterol (Calc): 68 mg/dL (calc) (ref <120)
Total CHOL/HDL Ratio: 2.2 (calc) (ref <5.0)
Triglycerides: 65 mg/dL (ref <90)

## 2017-11-28 LAB — CBC
HEMATOCRIT: 39.2 % (ref 38.5–50.0)
HEMOGLOBIN: 13.5 g/dL (ref 13.2–17.1)
MCH: 31 pg (ref 27.0–33.0)
MCHC: 34.4 g/dL (ref 32.0–36.0)
MCV: 90.1 fL (ref 80.0–100.0)
MPV: 10.4 fL (ref 7.5–12.5)
Platelets: 248 10*3/uL (ref 140–400)
RBC: 4.35 10*6/uL (ref 4.20–5.80)
RDW: 11.2 % (ref 11.0–15.0)
WBC: 6.4 10*3/uL (ref 3.8–10.8)

## 2017-11-28 LAB — COMPREHENSIVE METABOLIC PANEL
AG Ratio: 1.5 (calc) (ref 1.0–2.5)
ALKALINE PHOSPHATASE (APISO): 64 U/L (ref 48–230)
ALT: 39 U/L (ref 8–46)
AST: 32 U/L (ref 12–32)
Albumin: 4.1 g/dL (ref 3.6–5.1)
BUN: 7 mg/dL (ref 7–20)
CALCIUM: 9.5 mg/dL (ref 8.9–10.4)
CHLORIDE: 103 mmol/L (ref 98–110)
CO2: 28 mmol/L (ref 20–32)
Creat: 0.85 mg/dL (ref 0.60–1.26)
GLOBULIN: 2.8 g/dL (ref 2.1–3.5)
Glucose, Bld: 85 mg/dL (ref 65–99)
Potassium: 4.4 mmol/L (ref 3.8–5.1)
Sodium: 139 mmol/L (ref 135–146)
Total Bilirubin: 0.3 mg/dL (ref 0.2–1.1)
Total Protein: 6.9 g/dL (ref 6.3–8.2)

## 2017-11-28 LAB — T-HELPER CELL (CD4) - (RCID CLINIC ONLY)
CD4 % Helper T Cell: 25 % — ABNORMAL LOW (ref 33–55)
CD4 T Cell Abs: 620 /uL (ref 400–2700)

## 2017-11-28 LAB — RPR: RPR: NONREACTIVE

## 2017-11-28 NOTE — BH Specialist Note (Signed)
Integrated Behavioral Health Initial Visit  MRN: [id number] Name: Jeff EkeChristopher J Tinner  Number of Integrated Behavioral Health Clinician visits:: 1/6 Session Start time: 2:12 pm  Session End time: 2:38 pm Total time: 26 mins  Type of Service: Integrated Behavioral Health- Individual/Family Interpretor:No. Interpretor Name and Language: N/A   Warm Hand Off Completed.       SUBJECTIVE: Jeff Daniel is a 22 y.o. male accompanied by self Patient was referred by Dr. Orvan Falconerampbell for depressive symptoms.  Patient reports the following symptoms/concerns: "down" mood, "barely any facial expression", "lifeless" mood, "In my body but not quite living".  Patient stated that he felt hopeful about his symptoms improving but reported being incapable of knowing how to change.  Anhedonia due to lack of time due to school and due to depression.  Okay appetite, increase in sleeping 7-9 hours a night, Tangential thought process- hard to focus on one thing since mid 2018, decreased energy, social isolation, and general thoughts of death.  Thoughts are based on the feeling that he can't do anything right and feeling like a failure. Patient reported that he feels pressured to get good grades for his future and has daily future-oriented worry.  Patient denied current suicidal ideations, plan, or intent.  [facility] the potential benefits of antidepressant medications and on the coupling benefits of combining medication management with talk therapy.  Patient reported that he is interested in starting antidepressant medications and wanted to speak with the medical provider again.  Patient also [facility].  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 Self-Care: Patient is able to tend to his ADL's. Life Changes: Patient returned to school for the Spring semester.  ASSESSMENT: Patient is currently experiencing depression and anxiety and may benefit from behavioral health services and medication management.  GOALS ADDRESSED: Patient will: 1. Reduce symptoms of: anxiety and depression 2. Increase knowledge and/or ability of: coping skills, healthy habits and self-management skills  3. Demonstrate ability to: Increase healthy adjustment to current life circumstances and Increase adequate support systems for patient/family  INTERVENTIONS: Interventions utilized: Supportive Counseling and Psychoeducation and/or Health Education   PLAN: 1. Follow up with behavioral health clinician on : Wed Feb 6th at 3:30 pm    Vergia AlbertsSherry Jaylei Fuerte, [facility]

## 2017-11-29 LAB — HIV-1 RNA QUANT-NO REFLEX-BLD
HIV 1 RNA Quant: <20 copies/mL
HIV-1 RNA Quant, Log: <1.3 Log copies/mL

## 2017-12-06 ENCOUNTER — Ambulatory Visit: Payer: Self-pay | Admitting: Licensed Clinical Social Worker

## 2017-12-12 ENCOUNTER — Ambulatory Visit: Payer: Self-pay | Admitting: Family Medicine

## 2018-01-08 ENCOUNTER — Ambulatory Visit: Payer: Self-pay | Admitting: Internal Medicine

## 2018-03-01 ENCOUNTER — Other Ambulatory Visit: Payer: Self-pay | Admitting: Internal Medicine

## 2018-03-01 DIAGNOSIS — B20 Human immunodeficiency virus [HIV] disease: Secondary | ICD-10-CM

## 2018-03-13 ENCOUNTER — Encounter: Payer: Self-pay | Admitting: Internal Medicine

## 2018-03-13 ENCOUNTER — Ambulatory Visit (INDEPENDENT_AMBULATORY_CARE_PROVIDER_SITE_OTHER): Admitting: Licensed Clinical Social Worker

## 2018-03-13 ENCOUNTER — Ambulatory Visit (INDEPENDENT_AMBULATORY_CARE_PROVIDER_SITE_OTHER): Admitting: Internal Medicine

## 2018-03-13 DIAGNOSIS — F4329 Adjustment disorder with other symptoms: Secondary | ICD-10-CM | POA: Diagnosis not present

## 2018-03-13 DIAGNOSIS — Z21 Asymptomatic human immunodeficiency virus [HIV] infection status: Secondary | ICD-10-CM

## 2018-03-13 DIAGNOSIS — F17200 Nicotine dependence, unspecified, uncomplicated: Secondary | ICD-10-CM

## 2018-03-13 DIAGNOSIS — F331 Major depressive disorder, recurrent, moderate: Secondary | ICD-10-CM | POA: Diagnosis not present

## 2018-03-13 NOTE — BH Specialist Note (Signed)
Integrated Behavioral Health Initial Visit  MRN: [id number] Name: Jeff Daniel  Number of [place] Clinician visits:: 1/6 Session Start time: 11:15am  Session End time: 11:33am Total time: 15 minutes  Type of Service: [place] Interpretor:No. Interpretor Name and Language: n/a   Warm Hand Off Completed.       SUBJECTIVE: Jeff Daniel is a 22 y.o. male accompanied by self Patient was referred by Dr. Megan Salon for anxiety. Patient reports the following symptoms/concerns: anxiety, irritability Severity of problem: moderate  OBJECTIVE: Mood: Anxious and Affect: Blunt Risk of harm to self or others: No plan to harm self or others  LIFE CONTEXT: Family and Social: Patient lives with his parents after finishing the semester at school. He reports this to be both a positive and negative thing, mostly relative to the parent/child independence struggle. He states he has supportive friends and a supportive boyfriend. Reports he is doing well with social relationships, though he sometimes gets "snappy" and irritable. School/Work: Trying to decide whether to go to school for the summer, enrolled at [employer] during the year. Self-Care: Prefers natural means of stress management, including meditation and yoga  GOALS ADDRESSED: Patient will: 1. Reduce symptoms of: anxiety and irritability   INTERVENTIONS: Interventions utilized: Solution-Focused Strategies, Supportive Counseling and Psychoeducation and/or Health Education   ASSESSMENT: Patient currently experiencing anxiety with some panic attacks, irritability in relationships and especially when stressed. He reports that there are no real circumstantial triggers, but that he gets frustrated easily and finds himself taking out on the people he cares about. 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 Counselor discussed with patient  his experience of anxiety and irritability. Patient shares that his anxiety got worse after he began and then stopped taking Paxil. He indicates that he would like to find a more natural way to address anxiety and irritability. Counselor and patient explored different types of anger release and the needs met by them. Patient would like to attend regular counseling sessions to address anxiety and to explore his anger/irritabilty to keep it from messing up relationships.    Patient may benefit from ongoing counseling.   PLAN: 1. Follow up with behavioral health clinician on : 03/20/18 at [place], LCSW

## 2018-03-13 NOTE — Progress Notes (Signed)
Patient Active Problem List   Diagnosis Date Noted  . 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 HIV (human immunodeficiency virus infection) (HCC) 05/04/2016    Priority: High  . Depression 07/12/2016    Priority: Medium  . Outbursts of anger 07/12/2016    Priority: Medium  . Current smoker 11/27/2017  . Onychomycosis 02/20/2017  . Nephrolithiasis 08/22/2016  . Constipation 05/04/2016  . Chronic cough 04/29/2016  . High risk sexual behavior 04/28/2016  . Seasonal allergies 03/25/2015    Patient's Medications  New Prescriptions   No medications on file  Previous Medications   PAROXETINE (PAXIL) 20 MG TABLET    Take 1 tablet (20 mg total) by mouth daily.   TERBINAFINE (LAMISIL) 250 MG TABLET    Take 1 tablet (250 mg total) by mouth daily.   TRIUMEQ 600-50-300 MG TABLET    TAKE 1 TABLET BY MOUTH DAILY  Modified Medications   No medications on file  Discontinued Medications   No medications on file    Subjective: This is in for his routine follow-up visit.  He denies missing any doses of Triumeq since his last visit in January.  He is currently in between semesters at school.  He is looking for work.  He is still living at home with his parents.  He is still in his relationship with his HIV negative male partner.  They are using condoms consistently.  He believes his partner went to see his PCP recently to discuss PrEP but he is not sure if he is taking it.  He did start paroxetine after his visit in January.  He felt like his mood stabilized and he felt less anxious.  However he failed to get his refill and ran out abruptly.  He went on vacation and shortly after stopping it he developed more severe panic attacks.  He became concerned that paroxetine was just masking how he normally feels so he did not restart it.  He says that he prefers to try to deal with his anxiety and depression through meditation.  He also mentioned that he is consider trying CBD  products.  He has not been smoking cigarettes recently.  He has used E cigarettes on occasion but is hoping to stop those as well.  Review of Systems: Review of Systems  Constitutional: Negative for chills, diaphoresis, fever, malaise/fatigue and weight loss.  HENT: Negative for sore throat.   Respiratory: Negative for cough, sputum production and shortness of breath.   Cardiovascular: Negative for chest pain.  Gastrointestinal: Negative for abdominal pain, diarrhea, heartburn, nausea and vomiting.  Genitourinary: Negative for dysuria and frequency.  Musculoskeletal: Negative for joint pain and myalgias.  Skin: Negative for rash.  Neurological: Negative for dizziness and headaches.  Psychiatric/Behavioral: Positive for depression. Negative for substance abuse and suicidal ideas. The patient is nervous/anxious.     Past Medical History:  Diagnosis Date  . HIV (human immunodeficiency virus infection) (HCC)     Social History   Tobacco Use  . Smoking status: Former Smoker    Types: Cigars  . Smokeless tobacco: Never Used  Substance Use Topics  . Alcohol use: Yes  . Drug use: Yes    Types: Marijuana    Family History  Problem Relation Age of Onset  . Hypertension Mother   . Hypertension Maternal Grandmother   . Diabetes Maternal Grandmother   . Hypertension Maternal Grandfather   . Diabetes Maternal Grandfather   .  Hypertension Paternal Grandmother   . Diabetes Paternal Grandmother   . Hypertension Paternal Grandfather   . Diabetes Paternal Grandfather     Allergies  Allergen Reactions  . Pollen Extract     Health Maintenance  Topic Date Due  . INFLUENZA VACCINE  05/31/2018  . TETANUS/TDAP  02/10/2027    Objective:  Vitals:   03/13/18 1116  BP: 118/70  Pulse: 66  Temp: 98.3 F (36.8 C)  TempSrc: Oral  Weight: 137 lb (62.1 kg)   Body mass index is 22.11 kg/m.  Physical Exam  Constitutional: He is oriented to person, place, and time.  He is calm,  pleasant and in good spirits.  HENT:  Mouth/Throat: No oropharyngeal exudate.  Eyes: Conjunctivae are normal.  Cardiovascular: Normal rate and regular rhythm.  No murmur heard. Pulmonary/Chest: Effort normal and breath sounds normal.  Abdominal: Soft. He exhibits no mass. There is no tenderness.  Musculoskeletal: Normal range of motion.  Neurological: He is alert and oriented to person, place, and time.  Skin: No rash noted.  Psychiatric: He has a normal mood and affect.    Lab Results Lab Results  Component Value Date   WBC 6.4 11/27/2017   HGB 13.5 11/27/2017   HCT 39.2 11/27/2017   MCV 90.1 11/27/2017   PLT 248 11/27/2017    Lab Results  Component Value Date   CREATININE 0.85 11/27/2017   BUN 7 11/27/2017   NA 139 11/27/2017   K 4.4 11/27/2017   CL 103 11/27/2017   CO2 28 11/27/2017    Lab Results  Component Value Date   ALT 39 11/27/2017   AST 32 11/27/2017   ALKPHOS 73 09/15/2017   BILITOT 0.3 11/27/2017    Lab Results  Component Value Date   CHOL 124 11/27/2017   HDL 56 11/27/2017   LDLCALC 54 11/27/2017   TRIG 65 11/27/2017   CHOLHDL 2.2 11/27/2017   Lab Results  Component Value Date   LABRPR NON-REACTIVE 11/27/2017   HIV 1 RNA Quant (copies/mL)  Date Value  11/27/2017 <20 NOT DETECTED  05/15/2017 <20 DETECTED (A)  12/22/2016 59 (H)   CD4 T Cell Abs (/uL)  Date Value  11/27/2017 620  05/15/2017 750  12/22/2016 520     Problem List Items Addressed This Visit      High   HIV (human immunodeficiency virus infection) (HCC)    His adherence is perfect and as a result his infection is under excellent, long-term control.  He will continue Triumeq and follow-up for blood work in 3 months.  I will let him know that his partner could come here for PrEP if he would like to.        Medium   Depression    I had him meet with our behavioral health counselor today.        Unprioritized   Current smoker    I congratulated him on his efforts to  quit smoking.           Cliffton Asters, MD [facility] [phone] pager   [phone] cell 03/13/2018, 11:42 AM

## 2018-03-13 NOTE — Assessment & Plan Note (Signed)
I congratulated him on his efforts to quit smoking.

## 2018-03-13 NOTE — Assessment & Plan Note (Addendum)
His adherence is perfect and as a result his infection is under excellent, long-term control.  He will continue Triumeq and follow-up for blood work in 3 months.  I will let him know that his partner could come here for PrEP if he would like to.

## 2018-03-13 NOTE — Assessment & Plan Note (Signed)
I had him meet with our behavioral health counselor today. 

## 2018-03-19 ENCOUNTER — Ambulatory Visit (INDEPENDENT_AMBULATORY_CARE_PROVIDER_SITE_OTHER): Admitting: Licensed Clinical Social Worker

## 2018-03-19 DIAGNOSIS — F4329 Adjustment disorder with other symptoms: Secondary | ICD-10-CM | POA: Diagnosis not present

## 2018-03-19 NOTE — BH Specialist Note (Signed)
Integrated Behavioral Health Follow Up Visit  MRN: [id number] Name: Jeff Daniel  Number of Integrated Behavioral Health Clinician visits: 1/6 Session Start time: 2:14pm  Session End time: 3:07pm Total time: 55 minutes  Type of Service: Integrated Behavioral Health- Individual/Family Interpretor:No. Interpretor Name and Language: n/a  SUBJECTIVE: Jeff Daniel is a 22 y.o. male accompanied by self Patient reports the following symptoms/concerns: sadness, confusion, difficulty articulating and acting on emotions   OBJECTIVE: Mood: Depressed and Affect: Constricted Risk of harm to self or others: No plan to harm self or others  LIFE CONTEXT: Patient reports that a friend of his died 6 days ago due to drowning in a pool. He does not know the details and whether it was a suicide, but feels a bit worried that perhaps he did not pick up on signs when he talked to his friend a few days before. Yesterday, patient thought his boyfriend had a romantic surprise planned for him but instead boyfriend confessed to cheating on him and they broke up. Patient states that he attended church yesterday and believes that this is where he will find community that he has lost with the ending of friendships and relationships lately.   GOALS ADDRESSED: Patient will: 1.  Reduce symptoms of: anxiety and depression  2.  Increase knowledge and/or ability of: coping skills    INTERVENTIONS: Interventions utilized:  Brief CBT and Supportive Counseling  ASSESSMENT: Patient currently experiencing sadness, flat/constricted affect, difficulty emoting, feeling of numbness. He reports that he took a couple of days to mourn his friend and feels better now, but worries that it will hit him again soon. He indicates that he does not know how to feel about his breakup with boyfriend. Counselor normalized patient's shock as a part of grief, both over the loss of friend and boyfriend. Counselor processed with  patient his thoughts and feelings around these relationships. Patient struggled to identify his feelings, but was able to talk about his experiences with the church. He is unsure about the stance of the church on the LGBT+ community, and although he feels comfortable is not sure what to do if something homophobic comes up. Counselor guided patient to identify his beliefs about himself in regard to his sexual orientation. Counselor and patient discussed boundaries around his identity and statements by the church that may/may not violate his boundaries.   Patient may benefit from ongoing weekly counseling.   PLAN: 1. Follow up with behavioral health clinician on : 03/28/18 at  2pm.   Angus Palms, LCSW

## 2018-03-27 ENCOUNTER — Other Ambulatory Visit: Payer: Self-pay | Admitting: Internal Medicine

## 2018-03-27 DIAGNOSIS — B20 Human immunodeficiency virus [HIV] disease: Secondary | ICD-10-CM

## 2018-03-27 DIAGNOSIS — Z21 Asymptomatic human immunodeficiency virus [HIV] infection status: Secondary | ICD-10-CM

## 2018-03-28 ENCOUNTER — Institutional Professional Consult (permissible substitution): Payer: No Typology Code available for payment source | Admitting: Licensed Clinical Social Worker

## 2018-03-30 ENCOUNTER — Ambulatory Visit: Payer: No Typology Code available for payment source | Admitting: Family Medicine

## 2018-04-10 ENCOUNTER — Ambulatory Visit: Payer: No Typology Code available for payment source | Admitting: Family Medicine

## 2018-04-25 ENCOUNTER — Ambulatory Visit: Payer: No Typology Code available for payment source | Admitting: Family Medicine

## 2018-05-09 ENCOUNTER — Ambulatory Visit: Payer: No Typology Code available for payment source | Admitting: Family Medicine

## 2018-06-12 ENCOUNTER — Ambulatory Visit (INDEPENDENT_AMBULATORY_CARE_PROVIDER_SITE_OTHER): Admitting: Internal Medicine

## 2018-06-12 ENCOUNTER — Encounter: Payer: Self-pay | Admitting: Internal Medicine

## 2018-06-12 DIAGNOSIS — Z21 Asymptomatic human immunodeficiency virus [HIV] infection status: Secondary | ICD-10-CM

## 2018-06-12 DIAGNOSIS — F17200 Nicotine dependence, unspecified, uncomplicated: Secondary | ICD-10-CM

## 2018-06-12 DIAGNOSIS — F3341 Major depressive disorder, recurrent, in partial remission: Secondary | ICD-10-CM | POA: Diagnosis not present

## 2018-06-12 NOTE — Assessment & Plan Note (Signed)
I talked to him again about the utmost importance of complete adherence with Triumeq.  He will get lab work today and follow-up in 6 months.

## 2018-06-12 NOTE — Assessment & Plan Note (Signed)
Encouraged him to commit to complete cigarette cessation.

## 2018-06-12 NOTE — Progress Notes (Signed)
Patient Active Problem List   Diagnosis Date Noted  . 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 HIV (human immunodeficiency virus infection) (HCC) 05/04/2016    Priority: High  . Depression 07/12/2016    Priority: Medium  . Outbursts of anger 07/12/2016    Priority: Medium  . Current smoker 11/27/2017  . Onychomycosis 02/20/2017  . Nephrolithiasis 08/22/2016  . Constipation 05/04/2016  . Chronic cough 04/29/2016  . High risk sexual behavior 04/28/2016  . Seasonal allergies 03/25/2015    Patient's Medications  New Prescriptions   No medications on file  Previous Medications   PAROXETINE (PAXIL) 20 MG TABLET    Take 1 tablet (20 mg total) by mouth daily.   TERBINAFINE (LAMISIL) 250 MG TABLET    Take 1 tablet (250 mg total) by mouth daily.   TRIUMEQ 600-50-300 MG TABLET    TAKE 1 TABLET BY MOUTH DAILY  Modified Medications   No medications on file  Discontinued Medications   No medications on file    Subjective: Jeff Daniel is in for his routine HIV follow-up visit.  He has had no problems obtaining, taking or tolerating Triumeq but he still continues to miss doses.  He missed a dose last week when he forgot and estimates that he may be misses 2-3 times each month.  He will be returning to school in the next few weeks.  He is still looking for work.  He is still living at home.  He is not having as much trouble with his anxiety and depression.  He says that he has not been smoking cigarettes and that his e-cigarette broke about 1 month ago  Review of Systems: Review of Systems  Constitutional: Negative for chills, diaphoresis, fever, malaise/fatigue and weight loss.  HENT: Negative for sore throat.   Respiratory: Negative for cough, sputum production and shortness of breath.   Cardiovascular: Negative for chest pain.  Gastrointestinal: Negative for abdominal pain, diarrhea, heartburn, nausea and vomiting.  Genitourinary: Negative for dysuria and frequency.    Musculoskeletal: Negative for joint pain and myalgias.  Skin: Negative for rash.  Neurological: Negative for dizziness and headaches.  Psychiatric/Behavioral: Negative for depression and substance abuse. The patient is not nervous/anxious.     Past Medical History:  Diagnosis Date  . HIV (human immunodeficiency virus infection) (HCC)     Social History   Tobacco Use  . Smoking status: Former Smoker    Types: Cigars  . Smokeless tobacco: Never Used  Substance Use Topics  . Alcohol use: Yes  . Drug use: Yes    Types: Marijuana    Family History  Problem Relation Age of Onset  . Hypertension Mother   . Hypertension Maternal Grandmother   . Diabetes Maternal Grandmother   . Hypertension Maternal Grandfather   . Diabetes Maternal Grandfather   . Hypertension Paternal Grandmother   . Diabetes Paternal Grandmother   . Hypertension Paternal Grandfather   . Diabetes Paternal Grandfather     Allergies  Allergen Reactions  . Pollen Extract     Health Maintenance  Topic Date Due  . INFLUENZA VACCINE  05/31/2018  . TETANUS/TDAP  02/10/2027    Objective:  Vitals:   06/12/18 1402  BP: 120/71  Pulse: 77  Temp: 98.3 F (36.8 C)  Weight: 139 lb 12.8 oz (63.4 kg)   Body mass index is 22.56 kg/m.  Physical Exam  Constitutional: He is oriented to person, place, and time.  HENT:  Mouth/Throat: No oropharyngeal exudate.  Eyes: Conjunctivae are normal.  Cardiovascular: Normal rate, regular rhythm and normal heart sounds.  No murmur heard. Pulmonary/Chest: Effort normal and breath sounds normal.  Abdominal: Soft. He exhibits no mass. There is no tenderness.  Musculoskeletal: Normal range of motion.  Neurological: He is alert and oriented to person, place, and time.  Skin: No rash noted.    Lab Results Lab Results  Component Value Date   WBC 6.4 11/27/2017   HGB 13.5 11/27/2017   HCT 39.2 11/27/2017   MCV 90.1 11/27/2017   PLT 248 11/27/2017    Lab Results   Component Value Date   CREATININE 0.85 11/27/2017   BUN 7 11/27/2017   NA 139 11/27/2017   K 4.4 11/27/2017   CL 103 11/27/2017   CO2 28 11/27/2017    Lab Results  Component Value Date   ALT 39 11/27/2017   AST 32 11/27/2017   ALKPHOS 73 09/15/2017   BILITOT 0.3 11/27/2017    Lab Results  Component Value Date   CHOL 124 11/27/2017   HDL 56 11/27/2017   LDLCALC 54 11/27/2017   TRIG 65 11/27/2017   CHOLHDL 2.2 11/27/2017   Lab Results  Component Value Date   LABRPR NON-REACTIVE 11/27/2017   HIV 1 RNA Quant (copies/mL)  Date Value  11/27/2017 <20 NOT DETECTED  05/15/2017 <20 DETECTED (A)  12/22/2016 59 (H)   CD4 T Cell Abs (/uL)  Date Value  11/27/2017 620  05/15/2017 750  12/22/2016 520     Problem List Items Addressed This Visit      High   HIV (human immunodeficiency virus infection) (HCC)    I talked to him again about the utmost importance of complete adherence with Triumeq.  He will get lab work today and follow-up in 6 months.      Relevant Orders   T-helper cell (CD4)- ([facility] only)   HIV 1 RNA quant-no reflex-bld     Medium   Depression    His anxiety and depression are under much better control.        Unprioritized   Current smoker    Encouraged him to commit to complete cigarette cessation.           Cliffton AstersJohn Carren Blakley, MD [facility] [phone] pager   [phone] cell 06/12/2018, 3:11 PM

## 2018-06-12 NOTE — Assessment & Plan Note (Signed)
His anxiety and depression are under much better control.

## 2018-06-14 LAB — HIV-1 RNA QUANT-NO REFLEX-BLD
HIV 1 RNA Quant: 21 copies/mL — ABNORMAL HIGH
HIV-1 RNA QUANT, LOG: 1.32 {Log_copies}/mL — AB

## 2018-06-14 LAB — T-HELPER CELL (CD4) - (RCID CLINIC ONLY)
CD4 T CELL ABS: 690 /uL (ref 400–2700)
CD4 T CELL HELPER: 35 % (ref 33–55)

## 2018-06-18 ENCOUNTER — Ambulatory Visit: Payer: No Typology Code available for payment source | Admitting: Licensed Clinical Social Worker

## 2018-07-27 ENCOUNTER — Ambulatory Visit: Admitting: Family Medicine

## 2018-07-31 ENCOUNTER — Ambulatory Visit: Admitting: Family Medicine

## 2018-10-27 ENCOUNTER — Other Ambulatory Visit: Payer: Self-pay | Admitting: Internal Medicine

## 2018-10-27 DIAGNOSIS — B20 Human immunodeficiency virus [HIV] disease: Secondary | ICD-10-CM

## 2018-10-27 DIAGNOSIS — Z21 Asymptomatic human immunodeficiency virus [HIV] infection status: Secondary | ICD-10-CM

## 2018-12-03 ENCOUNTER — Other Ambulatory Visit: Payer: Self-pay | Admitting: *Deleted

## 2018-12-03 ENCOUNTER — Other Ambulatory Visit: Payer: No Typology Code available for payment source

## 2018-12-03 DIAGNOSIS — Z113 Encounter for screening for infections with a predominantly sexual mode of transmission: Secondary | ICD-10-CM

## 2018-12-03 DIAGNOSIS — Z79899 Other long term (current) drug therapy: Secondary | ICD-10-CM

## 2018-12-03 DIAGNOSIS — Z21 Asymptomatic human immunodeficiency virus [HIV] infection status: Secondary | ICD-10-CM

## 2018-12-17 ENCOUNTER — Encounter: Admitting: Internal Medicine

## 2018-12-19 ENCOUNTER — Other Ambulatory Visit

## 2018-12-26 ENCOUNTER — Ambulatory Visit: Admitting: Family Medicine

## 2019-01-02 ENCOUNTER — Encounter: Admitting: Internal Medicine

## 2019-01-10 ENCOUNTER — Ambulatory Visit (INDEPENDENT_AMBULATORY_CARE_PROVIDER_SITE_OTHER): Admitting: Family Medicine

## 2019-01-10 ENCOUNTER — Encounter: Payer: Self-pay | Admitting: Family Medicine

## 2019-01-10 ENCOUNTER — Other Ambulatory Visit: Payer: Self-pay

## 2019-01-10 VITALS — BP 110/60 | HR 120 | Temp 98.5°F | Ht 67.0 in | Wt 133.0 lb

## 2019-01-10 DIAGNOSIS — R197 Diarrhea, unspecified: Secondary | ICD-10-CM

## 2019-01-10 NOTE — Patient Instructions (Signed)
It was a pleasure to see you today! Thank you for choosing [facility] for your primary care. Jeff Daniel was seen for diarrhea.   Our plans for today were:  Please try peptobismol over the counter.   We will call you with lab results.   Regardless, please call and reschedule your appointment with Dr. Orvan Falconer.    Best,  Dr. Chanetta Marshall

## 2019-01-10 NOTE — Progress Notes (Signed)
   CC: diarrhea  HPI  Upset stomach - present x 1 month. About a month ago he saw his friend who had some sort of stomach bug, this friend went to the hospital and said that he couldn't eat certain food. Shortly after returning (maybe 4-5 days), he started having abdominal pain in epigastric. No nausea. No vomiting. Watery diarrhea since then 2-3 times per day and now last night 7 times today. No laxatives, nothing OTC. Taking just triumeq daily. No hx of similar diarrhea episodes. No blood in the diarrhea. Before this all started, he had BMs 3 times per day, bristol 2. No fevers. No SOB.   Patient is tachycardic with normal HR in past visits - he admits to smoking THC right before coming into the office. He has injected conjunctiva as well.   ROS: Denies CP, SOB, abdominal pain, dysuria, changes in BMs.   CC, SH/smoking status, and VS noted  Objective: BP 110/60   Pulse (!) 120   Temp 98.5 F (36.9 C) (Oral)   Ht 5\' 7"  (1.702 m)   Wt 133 lb (60.3 kg)   BMI 20.83 kg/m  Gen: NAD, alert, cooperative.  HEENT: NCAT, EOMI, PERRL, conjunctiva injected bilaterally  CV: RRR, no murmur Resp: CTAB, no wheezes, non-labored Abd: SNTND, BS present, no guarding or organomegaly Ext: No edema, warm Neuro: Alert and oriented, Speech clear, No gross deficits  Assessment and plan:  Diarrhea - unclear etiology. Given weight loss of about 6 lbs, we will check for infectious etiology with O/P and also check electrolytes. He will trial peptobismol.   HIV - unlikely contributing, his last counts were almost undetectable. I did ask him to reschedule with Dr. Orvan Falconer as he recently no showed this appt.   Orders Placed This Encounter  Procedures  . Ova and parasite examination    Standing Status:   Future    Standing Expiration Date:   01/10/2020  . CBC with Differential/Platelet  . Basic metabolic panel  . TSH    No orders of the defined types were placed in this encounter.    Loni Muse, MD, PGY3 01/10/2019 2:48 PM

## 2019-01-11 ENCOUNTER — Telehealth: Payer: Self-pay | Admitting: *Deleted

## 2019-01-11 LAB — CBC WITH DIFFERENTIAL/PLATELET
Basophils Absolute: 0.1 10*3/uL (ref 0.0–0.2)
Basos: 1 %
EOS (ABSOLUTE): 0.4 10*3/uL (ref 0.0–0.4)
Eos: 7 %
HEMATOCRIT: 40.5 % (ref 37.5–51.0)
HEMOGLOBIN: 14.2 g/dL (ref 13.0–17.7)
Immature Grans (Abs): 0 10*3/uL (ref 0.0–0.1)
Immature Granulocytes: 0 %
Lymphocytes Absolute: 2.1 10*3/uL (ref 0.7–3.1)
Lymphs: 31 %
MCH: 30.9 pg (ref 26.6–33.0)
MCHC: 35.1 g/dL (ref 31.5–35.7)
MCV: 88 fL (ref 79–97)
MONOCYTES: 7 %
Monocytes Absolute: 0.5 10*3/uL (ref 0.1–0.9)
Neutrophils Absolute: 3.7 10*3/uL (ref 1.4–7.0)
Neutrophils: 54 %
Platelets: 322 10*3/uL (ref 150–450)
RBC: 4.6 x10E6/uL (ref 4.14–5.80)
RDW: 11.7 % (ref 11.6–15.4)
WBC: 6.8 10*3/uL (ref 3.4–10.8)

## 2019-01-11 LAB — BASIC METABOLIC PANEL
BUN/Creatinine Ratio: 6 — ABNORMAL LOW (ref 9–20)
BUN: 6 mg/dL (ref 6–20)
CO2: 22 mmol/L (ref 20–29)
Calcium: 9.2 mg/dL (ref 8.7–10.2)
Chloride: 104 mmol/L (ref 96–106)
Creatinine, Ser: 0.98 mg/dL (ref 0.76–1.27)
GFR calc non Af Amer: 111 mL/min/{1.73_m2} (ref >59)
GFR, EST AFRICAN AMERICAN: 128 mL/min/{1.73_m2} (ref >59)
Glucose: 102 mg/dL — ABNORMAL HIGH (ref 65–99)
Potassium: 3.8 mmol/L (ref 3.5–5.2)
Sodium: 143 mmol/L (ref 134–144)

## 2019-01-11 LAB — TSH: TSH: 0.642 u[IU]/mL (ref 0.450–4.500)

## 2019-01-11 NOTE — Telephone Encounter (Signed)
-----   Message from Garth Bigness, MD sent at 01/11/2019  9:57 AM EDT ----- Jeff Daniel team, please let patient know that his labs look normal and remind him to bring in stool sample as he is able.

## 2019-01-11 NOTE — Addendum Note (Signed)
Addended by: Shirlean Kelly on: 01/11/2019 03:13 PM   Modules accepted: Orders

## 2019-01-11 NOTE — Telephone Encounter (Signed)
Pt informed and stated he would bring sample today.April Zimmerman Rumple, CMA

## 2019-01-16 LAB — OVA AND PARASITE EXAMINATION

## 2019-03-25 ENCOUNTER — Other Ambulatory Visit: Payer: Self-pay | Admitting: Internal Medicine

## 2019-03-25 DIAGNOSIS — B20 Human immunodeficiency virus [HIV] disease: Secondary | ICD-10-CM

## 2019-04-06 ENCOUNTER — Other Ambulatory Visit: Payer: Self-pay | Admitting: Internal Medicine

## 2019-04-06 DIAGNOSIS — B20 Human immunodeficiency virus [HIV] disease: Secondary | ICD-10-CM

## 2019-05-31 ENCOUNTER — Other Ambulatory Visit: Payer: Self-pay

## 2019-05-31 DIAGNOSIS — Z20822 Contact with and (suspected) exposure to covid-19: Secondary | ICD-10-CM

## 2019-06-02 LAB — NOVEL CORONAVIRUS, NAA: SARS-CoV-2, NAA: DETECTED — AB

## 2019-06-03 ENCOUNTER — Telehealth: Payer: Self-pay

## 2019-06-03 NOTE — Telephone Encounter (Signed)
Please let the patient know he tested positive for covid-19. He will need to self-quarantine for 14 days, and if he has any significant symptoms will need a telemedicine visit.  Please also report to the health department.  Guadalupe Dawn MD PGY-3 Family Medicine Resident

## 2019-06-03 NOTE — Telephone Encounter (Signed)
Per office protocol the provider is to inform pt of positive results and CMA will report result to the Health Department. Deseree Kennon Holter, CMA

## 2019-06-03 NOTE — Telephone Encounter (Signed)
A representative from [facility] called nurse line to inform of positive covid test. She stated it is the PCP responsibility to inform patient and health department. Please advise.

## 2019-06-04 NOTE — Telephone Encounter (Signed)
Attempted to call patient about positive covid results. Did not get answer, no answering machine set up. Will continue to try  Guadalupe Dawn MD PGY-3 Family Medicine Resident

## 2019-06-12 ENCOUNTER — Other Ambulatory Visit: Payer: Self-pay

## 2019-06-12 DIAGNOSIS — Z20822 Contact with and (suspected) exposure to covid-19: Secondary | ICD-10-CM

## 2019-06-13 LAB — NOVEL CORONAVIRUS, NAA: SARS-CoV-2, NAA: NOT DETECTED

## 2019-09-17 ENCOUNTER — Other Ambulatory Visit (HOSPITAL_COMMUNITY)
Admission: RE | Admit: 2019-09-17 | Discharge: 2019-09-17 | Disposition: A | Discharge status: Home / Self Care | Source: Ambulatory Visit | Attending: Family Medicine | Admitting: Family Medicine

## 2019-09-17 ENCOUNTER — Encounter: Payer: Self-pay | Admitting: Family Medicine

## 2019-09-17 ENCOUNTER — Ambulatory Visit (INDEPENDENT_AMBULATORY_CARE_PROVIDER_SITE_OTHER): Admitting: Family Medicine

## 2019-09-17 ENCOUNTER — Other Ambulatory Visit: Payer: Self-pay

## 2019-09-17 VITALS — BP 130/62 | HR 127 | Wt 147.6 lb

## 2019-09-17 DIAGNOSIS — Z113 Encounter for screening for infections with a predominantly sexual mode of transmission: Secondary | ICD-10-CM

## 2019-09-17 DIAGNOSIS — B351 Tinea unguium: Secondary | ICD-10-CM | POA: Diagnosis not present

## 2019-09-17 DIAGNOSIS — Z7251 High risk heterosexual behavior: Secondary | ICD-10-CM

## 2019-09-17 DIAGNOSIS — Z21 Asymptomatic human immunodeficiency virus [HIV] infection status: Secondary | ICD-10-CM | POA: Diagnosis not present

## 2019-09-17 DIAGNOSIS — F3341 Major depressive disorder, recurrent, in partial remission: Secondary | ICD-10-CM

## 2019-09-17 DIAGNOSIS — R41840 Attention and concentration deficit: Secondary | ICD-10-CM

## 2019-09-17 NOTE — Patient Instructions (Signed)
It was great seeing you again!  I do recommend treating your toe fungus because you are fit into the high risk category given that your HIV is well controlled.  I also feel like the risk outweigh the benefits as far as treatment with terbinafine goes.  We performed some STD testing.  Along with that I will get a CD4 count to make sure that your HIV is well controlled.  Lastly I would make a referral to a psychiatrist for management of your ADHD as well as long-term management of your depression.  You should hear back from them in the next week or 2.

## 2019-09-18 ENCOUNTER — Other Ambulatory Visit

## 2019-09-18 DIAGNOSIS — Z113 Encounter for screening for infections with a predominantly sexual mode of transmission: Secondary | ICD-10-CM

## 2019-09-18 DIAGNOSIS — Z21 Asymptomatic human immunodeficiency virus [HIV] infection status: Secondary | ICD-10-CM

## 2019-09-19 LAB — T-HELPER CELLS CD4/CD8 %
% CD 4 Pos. Lymph.: 27 % — ABNORMAL LOW (ref 30.8–58.5)
Absolute CD 4 Helper: 756 /uL (ref 359–1519)
Basophils Absolute: 0.1 10*3/uL (ref 0.0–0.2)
Basos: 1 %
CD3+CD4+ Cells/CD3+CD8+ Cells Bld: 0.58 — ABNORMAL LOW (ref 0.92–3.72)
CD3+CD8+ Cells # Bld: 1313 /uL — ABNORMAL HIGH (ref 109–897)
CD3+CD8+ Cells NFr Bld: 46.9 % — ABNORMAL HIGH (ref 12.0–35.5)
EOS (ABSOLUTE): 0.1 10*3/uL (ref 0.0–0.4)
Eos: 2 %
Hematocrit: 42.7 % (ref 37.5–51.0)
Hemoglobin: 14.4 g/dL (ref 13.0–17.7)
Immature Grans (Abs): 0 10*3/uL (ref 0.0–0.1)
Immature Granulocytes: 0 %
Lymphocytes Absolute: 2.8 10*3/uL (ref 0.7–3.1)
Lymphs: 47 %
MCH: 30.4 pg (ref 26.6–33.0)
MCHC: 33.7 g/dL (ref 31.5–35.7)
MCV: 90 fL (ref 79–97)
Monocytes Absolute: 0.4 10*3/uL (ref 0.1–0.9)
Monocytes: 7 %
Neutrophils Absolute: 2.6 10*3/uL (ref 1.4–7.0)
Neutrophils: 43 %
Platelets: 244 10*3/uL (ref 150–450)
RBC: 4.73 x10E6/uL (ref 4.14–5.80)
RDW: 11.4 % — ABNORMAL LOW (ref 11.6–15.4)
WBC: 6 10*3/uL (ref 3.4–10.8)

## 2019-09-19 LAB — URINE CYTOLOGY ANCILLARY ONLY
Chlamydia: NEGATIVE
Comment: NEGATIVE
Comment: NORMAL
Neisseria Gonorrhea: NEGATIVE

## 2019-09-19 LAB — HCV COMMENT:

## 2019-09-19 LAB — HEPATITIS C ANTIBODY (REFLEX): HCV Ab: <0.1 s/co ratio (ref 0.0–0.9)

## 2019-09-19 LAB — RPR: RPR Ser Ql: NONREACTIVE

## 2019-09-23 ENCOUNTER — Encounter: Payer: Self-pay | Admitting: Family Medicine

## 2019-09-23 DIAGNOSIS — R41840 Attention and concentration deficit: Secondary | ICD-10-CM | POA: Insufficient documentation

## 2019-09-23 NOTE — Assessment & Plan Note (Signed)
Checked rpr, gc/chlamydia, hcv. All negative. HIV as above. Discussed safe sexual practices

## 2019-09-23 NOTE — Assessment & Plan Note (Addendum)
CD4 count in 750s. Advised to make sure and keep next scheduled follow up with ID. Continue triumeq daily.

## 2019-09-23 NOTE — Assessment & Plan Note (Signed)
Has an apparent remote history of adhd although I see no documentation in the chart. Will refer to psychiatry for evaluation and management given history of depression and anxiety.

## 2019-09-23 NOTE — Progress Notes (Signed)
   HPI 23 year old male who presents for std check and toenail overgrowth. He states that he has been having thickened nails for a lot time. He was last seen for this problem back in 2018 and terbenafine was discussed but ultimately decided against due to the high recurrence rate and possible liver side effects.  The patient is interested in being evaluated for adhd. He states that he has been unable to really focus and that is has gotten much worse recently. Apparently he had similar problems when he was in middle and high school. He states that he received a medication for it and it helped. His symptoms resolved after he got out of school and the medication was discontinued. He does not remember where he went for his psych care.  He is also interested in getting std testing. He has not had his cd4 count checked "in a little wile". At last check he was 690.  CC: toenail overgrowth   ROS:    Review of Systems See HPI for ROS.   CC, SH/smoking status, and VS noted  Objective: BP 130/62   Pulse (!) 127   Wt 147 lb 9.6 oz (67 kg)   SpO2 98%   BMI 23.12 kg/m  Gen: 23 year old AA male, no acute distress CV: rrr, no m/r/g Resp: lungs clear to auscultation bilaterally Abd: SNTND, BS present, no guarding or organomegaly Neuro: Alert and oriented, Speech clear, No gross deficits   Assessment and plan:  HIV (human immunodeficiency virus infection) (HCC) CD4 count in 750s. Advised to make sure and keep next scheduled follow up with ID. Continue triumeq daily.  Onychomycosis Explained to patient that the recurrence risk is >50% and would require length extensive treatment course. Discussed risks of liver side effects to patient and he opted against treatment.  High risk sexual behavior Checked rpr, gc/chlamydia, hcv. All negative. HIV as above. Discussed safe sexual practices  Depression Doing well from these standpoints although would like to discuss depression further with  psychiatrist. No thoughts of hurting himself or others. Psych referral placed for adhd and depression management.  Attention and concentration deficit Has an apparent remote history of adhd although I see no documentation in the chart. Will refer to psychiatry for evaluation and management given history of depression and anxiety.   Orders Placed This Encounter  Procedures  . Hepatitis c antibody (reflex)    Standing Status:   Future    Number of Occurrences:   1    Standing Expiration Date:   09/16/2020  . RPR    Standing Status:   Future    Number of Occurrences:   1    Standing Expiration Date:   09/16/2020  . T-helper cells CD4/CD8 %    Standing Status:   Future    Number of Occurrences:   1    Standing Expiration Date:   09/16/2020    No orders of the defined types were placed in this encounter.    Guadalupe Dawn MD PGY-3 Family Medicine Resident  09/23/2019 1:08 PM

## 2019-09-23 NOTE — Assessment & Plan Note (Signed)
Doing well from these standpoints although would like to discuss depression further with psychiatrist. No thoughts of hurting himself or others. Psych referral placed for adhd and depression management.

## 2019-09-23 NOTE — Assessment & Plan Note (Addendum)
Explained to patient that the recurrence risk is >50% and would require length extensive treatment course. Discussed risks of liver side effects to patient and he opted against treatment.

## 2019-12-23 ENCOUNTER — Other Ambulatory Visit: Payer: Self-pay

## 2019-12-23 DIAGNOSIS — Z20822 Contact with and (suspected) exposure to covid-19: Secondary | ICD-10-CM

## 2019-12-24 LAB — NOVEL CORONAVIRUS, NAA: SARS-CoV-2, NAA: NOT DETECTED

## 2019-12-25 ENCOUNTER — Encounter: Payer: Self-pay | Admitting: Family Medicine

## 2019-12-25 ENCOUNTER — Other Ambulatory Visit: Payer: Self-pay

## 2019-12-25 ENCOUNTER — Ambulatory Visit (INDEPENDENT_AMBULATORY_CARE_PROVIDER_SITE_OTHER): Admitting: Family Medicine

## 2019-12-25 ENCOUNTER — Other Ambulatory Visit (HOSPITAL_COMMUNITY)
Admission: RE | Admit: 2019-12-25 | Discharge: 2019-12-25 | Disposition: A | Discharge status: Home / Self Care | Source: Ambulatory Visit | Attending: Family Medicine | Admitting: Family Medicine

## 2019-12-25 VITALS — BP 115/60 | HR 62 | Wt 141.8 lb

## 2019-12-25 DIAGNOSIS — K921 Melena: Secondary | ICD-10-CM

## 2019-12-25 DIAGNOSIS — R21 Rash and other nonspecific skin eruption: Secondary | ICD-10-CM | POA: Diagnosis not present

## 2019-12-25 DIAGNOSIS — F3341 Major depressive disorder, recurrent, in partial remission: Secondary | ICD-10-CM

## 2019-12-25 DIAGNOSIS — R369 Urethral discharge, unspecified: Secondary | ICD-10-CM

## 2019-12-25 NOTE — Progress Notes (Signed)
HPI 24 year old male who presents for a variety of issues including penile pain, swelling, discharge and rectal bleeding.   He states that the penile swelling, pain, and discharge has been occurring for around a week. He was sexually active without protection in the 1-2 weeks leading up to this development.  He states that the rash is more of a mild discomfort when it brushes up against his clothes but is not exquisitely painful.  He has noted some discharge coming from the area, but states it is from the external area and not urethral meatus.  No bleeding noted.  Of note the patient is HIV positive and has not seen ID since 12/2018.  Patient for states he has been having blood in his stool for around a month.  Has had some accompanying abdominal pain.  The pain is sometimes present when he is is going back to day-to-day life, but sometimes is present when defecating.  He has not had any trauma to the area.  He denies any anal intercourse, no trauma to the area, or any other inciting event to account for this.  He shared with me a picture he took this for started a couple weeks ago.  He denies any fevers, chills, shortness of breath, weakness, or any other accompanying symptoms.  Lastly the patient endorses depression.  Scored very highly on his PHQ-9 but does not endorse any symptoms and asked up in verbally.  Patient placed a 1 on his PHQ-9 for thoughts of hurting himself but states that he has not had any thoughts any and not sure why he marked that.  He states that he would really like to talk about his penile pain and hematochezia and would defer discussion about depression until other time.  CC: Penile swelling, rectal bleeding   ROS:    Review of Systems See HPI for ROS.   CC, SH/smoking status, and VS noted  Objective: BP 115/60   Pulse 62   Wt 141 lb 12.8 oz (64.3 kg)   SpO2 99%   BMI 22.21 kg/m  Gen: Pleasant 24 year old male, no acute distress, resting comfortably CV: Regular  rate rhythm, no M/R/G Resp: Lungs clear to auscultation bilaterally, no accessory muscle use Abd: Soft, nontender, nondistended. GU: Erythematous rash noted circumferentially affecting anterior portion of foreskin and penile head.  Hypopigmented inclusions noted within.  No vesicular appearance noted.  No meatal trauma.  No anal fissure, no tearing, no external hemorrhoids noted on exam of rectal area. Neuro: Alert and oriented, Speech clear, No gross deficits Picture of stool taken from a picture on patient's phone     Assessment and plan:  Penile rash Unclear etiology.  Most likely diagnosis would be balanitis based on location.  Given sexual contact will test for RPR, GC/chlamydia.  If negative will treat with antifungal and strongly encouraged patient to follow-up with infectious disease.  We will go ahead and draw CD4 count so they will have this when he schedules an appointment.  Hematochezia Unclear etiology.  Nothing external on exam to make hemorrhoids or perhaps anal fissure likely.  Can continue to monitor.  Will get CBC to see if he has developed any anemia.  If continues for the next week or 2 will refer to gastroenterology.  Also recommended discussing with ID as his HIV may put him at risk for more atypical pathogens.  Depression Numerous items on PHQ Mark 2 or 3, question about self-harm marks is a 1.  When these items  were discussed with patient he stated that he does not have any ideation of hurting himself he is not sure why he marked that.  Patient opted to discuss current medical issues.  He will make an appointment to follow-up for depression discussion in the near future.  I reviewed sources to contact if he does have these thoughts in the future.  Orders Placed This Encounter  Procedures  . RPR  . CBC with Differential  . T-helper cells (CD4) count (not at [facility])    No orders of the defined types were placed in this encounter.   Guadalupe Dawn MD PGY-3 Family  Medicine Resident  12/26/2019 11:21 AM

## 2019-12-25 NOTE — Patient Instructions (Signed)
It was great seeing you again today!  Not sure what to make of your rectal bleeding but we will get a check of your blood count to make sure not getting too anemic.  If it continues for the next couple of weeks please let me know and I can send you to a GI specialist as I do not see anything on the outside to make me know what the causes.  Your penile infection seems like an STD, we will do some testing to see what the best course of treatment is.  I strongly recommend you to go see ID.

## 2019-12-26 ENCOUNTER — Encounter: Payer: Self-pay | Admitting: Family Medicine

## 2019-12-26 DIAGNOSIS — R21 Rash and other nonspecific skin eruption: Secondary | ICD-10-CM | POA: Insufficient documentation

## 2019-12-26 DIAGNOSIS — K921 Melena: Secondary | ICD-10-CM | POA: Insufficient documentation

## 2019-12-26 LAB — URINE CYTOLOGY ANCILLARY ONLY
Chlamydia: NEGATIVE
Comment: NEGATIVE
Comment: NORMAL
Neisseria Gonorrhea: NEGATIVE

## 2019-12-26 LAB — CBC WITH DIFFERENTIAL/PLATELET
Basophils Absolute: 0.1 10*3/uL (ref 0.0–0.2)
Basos: 1 %
EOS (ABSOLUTE): 0.1 10*3/uL (ref 0.0–0.4)
Eos: 1 %
Hematocrit: 43.8 % (ref 37.5–51.0)
Hemoglobin: 14.8 g/dL (ref 13.0–17.7)
Immature Grans (Abs): 0 10*3/uL (ref 0.0–0.1)
Immature Granulocytes: 0 %
Lymphocytes Absolute: 2.6 10*3/uL (ref 0.7–3.1)
Lymphs: 35 %
MCH: 30.6 pg (ref 26.6–33.0)
MCHC: 33.8 g/dL (ref 31.5–35.7)
MCV: 91 fL (ref 79–97)
Monocytes Absolute: 0.6 10*3/uL (ref 0.1–0.9)
Monocytes: 8 %
Neutrophils Absolute: 4 10*3/uL (ref 1.4–7.0)
Neutrophils: 55 %
Platelets: 303 10*3/uL (ref 150–450)
RBC: 4.84 x10E6/uL (ref 4.14–5.80)
RDW: 11.4 % — ABNORMAL LOW (ref 11.6–15.4)
WBC: 7.3 10*3/uL (ref 3.4–10.8)

## 2019-12-26 LAB — RPR: RPR Ser Ql: NONREACTIVE

## 2019-12-26 NOTE — Assessment & Plan Note (Signed)
Unclear etiology.  Nothing external on exam to make hemorrhoids or perhaps anal fissure likely.  Can continue to monitor.  Will get CBC to see if he has developed any anemia.  If continues for the next week or 2 will refer to gastroenterology.  Also recommended discussing with ID as his HIV may put him at risk for more atypical pathogens.

## 2019-12-26 NOTE — Assessment & Plan Note (Signed)
Unclear etiology.  Most likely diagnosis would be balanitis based on location.  Given sexual contact will test for RPR, GC/chlamydia.  If negative will treat with antifungal and strongly encouraged patient to follow-up with infectious disease.  We will go ahead and draw CD4 count so they will have this when he schedules an appointment.

## 2019-12-26 NOTE — Assessment & Plan Note (Signed)
Numerous items on PHQ Mark 2 or 3, question about self-harm marks is a 1.  When these items were discussed with patient he stated that he does not have any ideation of hurting himself he is not sure why he marked that.  Patient opted to discuss current medical issues.  He will make an appointment to follow-up for depression discussion in the near future.  I reviewed sources to contact if he does have these thoughts in the future.

## 2019-12-30 LAB — T-HELPER CELLS (CD4) COUNT (NOT AT ARMC): % CD 4 Pos. Lymph.: 26.6 % — ABNORMAL LOW (ref 30.8–58.5)

## 2020-02-17 ENCOUNTER — Other Ambulatory Visit: Payer: Self-pay | Admitting: Internal Medicine

## 2020-02-17 DIAGNOSIS — B20 Human immunodeficiency virus [HIV] disease: Secondary | ICD-10-CM

## 2020-04-20 ENCOUNTER — Other Ambulatory Visit: Payer: Self-pay

## 2020-04-20 DIAGNOSIS — B20 Human immunodeficiency virus [HIV] disease: Secondary | ICD-10-CM

## 2020-04-20 MED ORDER — TRIUMEQ 600-50-300 MG PO TABS: 1.0000 | ORAL_TABLET | Freq: Every day | ORAL | 0 refills | Status: DC

## 2020-04-20 NOTE — Telephone Encounter (Signed)
Patient called requesting medication refill. Patient states he ran out one week ago. Called pharmacy to verify that patient indeed has been picking up his medication (last picked up in March) #30 with 0 refills approved. Patient made aware no further refills unless he shows up to his appointment on 7/26 with Dr. Orvan Falconer.  Valarie Cones

## 2020-04-30 ENCOUNTER — Other Ambulatory Visit: Payer: Self-pay

## 2020-04-30 DIAGNOSIS — Z113 Encounter for screening for infections with a predominantly sexual mode of transmission: Secondary | ICD-10-CM

## 2020-04-30 DIAGNOSIS — Z79899 Other long term (current) drug therapy: Secondary | ICD-10-CM

## 2020-04-30 DIAGNOSIS — B20 Human immunodeficiency virus [HIV] disease: Secondary | ICD-10-CM

## 2020-05-07 ENCOUNTER — Other Ambulatory Visit: Payer: Self-pay

## 2020-05-07 DIAGNOSIS — B20 Human immunodeficiency virus [HIV] disease: Secondary | ICD-10-CM

## 2020-05-07 DIAGNOSIS — Z79899 Other long term (current) drug therapy: Secondary | ICD-10-CM

## 2020-05-07 DIAGNOSIS — Z113 Encounter for screening for infections with a predominantly sexual mode of transmission: Secondary | ICD-10-CM

## 2020-05-08 LAB — URINE CYTOLOGY ANCILLARY ONLY
Chlamydia: NEGATIVE
Comment: NEGATIVE
Comment: NORMAL
Neisseria Gonorrhea: NEGATIVE

## 2020-05-08 LAB — T-HELPER CELL (CD4) - (RCID CLINIC ONLY)
CD4 % Helper T Cell: 24 % — ABNORMAL LOW (ref 33–65)
CD4 T Cell Abs: 505 /uL (ref 400–1790)

## 2020-05-10 LAB — HIV-1 RNA QUANT-NO REFLEX-BLD
HIV 1 RNA Quant: 8050 copies/mL — ABNORMAL HIGH
HIV-1 RNA Quant, Log: 3.91 Log copies/mL — ABNORMAL HIGH

## 2020-05-10 LAB — COMPLETE METABOLIC PANEL WITH GFR
AG Ratio: 1.2 (calc) (ref 1.0–2.5)
ALT: 13 U/L (ref 9–46)
AST: 14 U/L (ref 10–40)
Albumin: 4.2 g/dL (ref 3.6–5.1)
Alkaline phosphatase (APISO): 74 U/L (ref 36–130)
BUN: 9 mg/dL (ref 7–25)
CO2: 28 mmol/L (ref 20–32)
Calcium: 9.4 mg/dL (ref 8.6–10.3)
Chloride: 103 mmol/L (ref 98–110)
Creat: 0.91 mg/dL (ref 0.60–1.35)
GFR, Est African American: 139 mL/min/{1.73_m2} (ref >=60)
GFR, Est Non African American: 120 mL/min/{1.73_m2} (ref >=60)
Globulin: 3.4 g/dL (calc) (ref 1.9–3.7)
Glucose, Bld: 87 mg/dL (ref 65–99)
Potassium: 4.4 mmol/L (ref 3.5–5.3)
Sodium: 139 mmol/L (ref 135–146)
Total Bilirubin: 0.5 mg/dL (ref 0.2–1.2)
Total Protein: 7.6 g/dL (ref 6.1–8.1)

## 2020-05-10 LAB — LIPID PANEL
Cholesterol: 114 mg/dL (ref <200)
HDL: 44 mg/dL (ref >=40)
LDL Cholesterol (Calc): 58 mg/dL (calc)
Non-HDL Cholesterol (Calc): 70 mg/dL (calc) (ref <130)
Total CHOL/HDL Ratio: 2.6 (calc) (ref <5.0)
Triglycerides: 50 mg/dL (ref <150)

## 2020-05-10 LAB — CBC WITH DIFFERENTIAL/PLATELET
Absolute Monocytes: 925 cells/uL (ref 200–950)
Basophils Absolute: 52 cells/uL (ref 0–200)
Basophils Relative: 0.7 %
Eosinophils Absolute: 52 cells/uL (ref 15–500)
Eosinophils Relative: 0.7 %
HCT: 41.6 % (ref 38.5–50.0)
Hemoglobin: 14.3 g/dL (ref 13.2–17.1)
Lymphs Abs: 2176 cells/uL (ref 850–3900)
MCH: 30.8 pg (ref 27.0–33.0)
MCHC: 34.4 g/dL (ref 32.0–36.0)
MCV: 89.5 fL (ref 80.0–100.0)
MPV: 10.9 fL (ref 7.5–12.5)
Monocytes Relative: 12.5 %
Neutro Abs: 4196 cells/uL (ref 1500–7800)
Neutrophils Relative %: 56.7 %
Platelets: 284 10*3/uL (ref 140–400)
RBC: 4.65 10*6/uL (ref 4.20–5.80)
RDW: 11 % (ref 11.0–15.0)
Total Lymphocyte: 29.4 %
WBC: 7.4 10*3/uL (ref 3.8–10.8)

## 2020-05-10 LAB — RPR: RPR Ser Ql: NONREACTIVE

## 2020-05-25 ENCOUNTER — Encounter: Admitting: Internal Medicine

## 2020-07-01 ENCOUNTER — Other Ambulatory Visit: Payer: Self-pay | Admitting: Internal Medicine

## 2020-07-01 DIAGNOSIS — B20 Human immunodeficiency virus [HIV] disease: Secondary | ICD-10-CM

## 2020-07-02 ENCOUNTER — Encounter: Payer: Self-pay | Admitting: Internal Medicine

## 2020-09-03 ENCOUNTER — Encounter: Payer: Self-pay | Admitting: Internal Medicine

## 2020-09-03 ENCOUNTER — Other Ambulatory Visit: Payer: Self-pay

## 2020-09-03 ENCOUNTER — Ambulatory Visit (INDEPENDENT_AMBULATORY_CARE_PROVIDER_SITE_OTHER): Payer: Self-pay | Admitting: Internal Medicine

## 2020-09-03 VITALS — BP 120/75 | HR 61 | Temp 98.0°F | Wt 143.0 lb

## 2020-09-03 DIAGNOSIS — F331 Major depressive disorder, recurrent, moderate: Secondary | ICD-10-CM

## 2020-09-03 DIAGNOSIS — B20 Human immunodeficiency virus [HIV] disease: Secondary | ICD-10-CM

## 2020-09-03 DIAGNOSIS — Z23 Encounter for immunization: Secondary | ICD-10-CM

## 2020-09-03 DIAGNOSIS — R197 Diarrhea, unspecified: Secondary | ICD-10-CM | POA: Insufficient documentation

## 2020-09-03 NOTE — Progress Notes (Signed)
Patient Active Problem List   Diagnosis Date Noted  . Diarrhea 09/03/2020    Priority: High  . 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 HIV (human immunodeficiency virus infection) (HCC) 05/04/2016    Priority: High  . Depression 07/12/2016    Priority: Medium  . Outbursts of anger 07/12/2016    Priority: Medium  . Penile rash 12/26/2019  . Hematochezia 12/26/2019  . Attention and concentration deficit 09/23/2019  . Current smoker 11/27/2017  . Onychomycosis 02/20/2017  . Nephrolithiasis 08/22/2016  . Chronic cough 04/29/2016  . High risk sexual behavior 04/28/2016  . Seasonal allergies 03/25/2015    Patient's Medications  New Prescriptions   No medications on file  Previous Medications   ABACAVIR-DOLUTEGRAVIR-LAMIVUDINE (TRIUMEQ) 600-50-300 MG TABLET    Take 1 tablet by mouth daily.  Modified Medications   No medications on file  Discontinued Medications   No medications on file    Subjective: Render is in for his first visit in 2 years.  To school insurance for month this past summer and was off of his Triumeq.  He thinks he was doing a good job taking his medication consistently when he had a supply.  He has been back on it now for several months and has not missed any doses.  He has taken his first 2 Pfizer vaccines.  He is feeling slightly more anxious and depressed recently.  He is feeling well other than having watery diarrhea for the last month.  Review of Systems: Review of Systems  Constitutional: Negative for fever and weight loss.  Respiratory: Negative for cough and shortness of breath.   Cardiovascular: Negative for chest pain.  Gastrointestinal: Positive for diarrhea and nausea. Negative for abdominal pain and vomiting.  Genitourinary: Negative for dysuria.  Skin: Negative for rash.  Psychiatric/Behavioral: Positive for depression. The patient is nervous/anxious.     Past Medical History:  Diagnosis Date  . HIV (human  immunodeficiency virus infection) (HCC)     Social History   Tobacco Use  . Smoking status: Former Smoker    Types: Cigars  . Smokeless tobacco: Never Used  Vaping Use  . Vaping Use: Some days  Substance Use Topics  . Alcohol use: Yes  . Drug use: Yes    Types: Marijuana    Family History  Problem Relation Age of Onset  . Hypertension Mother   . Hypertension Maternal Grandmother   . Diabetes Maternal Grandmother   . Hypertension Maternal Grandfather   . Diabetes Maternal Grandfather   . Hypertension Paternal Grandmother   . Diabetes Paternal Grandmother   . Hypertension Paternal Grandfather   . Diabetes Paternal Grandfather     Allergies  Allergen Reactions  . Pollen Extract     Health Maintenance  Topic Date Due  . COVID-19 Vaccine (1) Never done  . INFLUENZA VACCINE  05/31/2020  . TETANUS/TDAP  02/10/2027  . Hepatitis C Screening  Completed    Objective:  Vitals:   09/03/20 0917  BP: 120/75  Pulse: 61  Temp: 98 F (36.7 C)  TempSrc: Oral  Weight: 143 lb (64.9 kg)   Body mass index is 22.4 kg/m.  Physical Exam Constitutional:      Comments: He is quiet and a little more withdrawn than normal.  Cardiovascular:     Rate and Rhythm: Normal rate and regular rhythm.     Heart sounds: No murmur heard.   Pulmonary:     Effort:  Pulmonary effort is normal.     Breath sounds: Normal breath sounds.  Abdominal:     Palpations: Abdomen is soft.     Tenderness: There is no abdominal tenderness.  Musculoskeletal:        General: No swelling or tenderness.  Skin:    Findings: No rash.  Neurological:     General: No focal deficit present.     Lab Results Lab Results  Component Value Date   WBC 7.4 05/07/2020   HGB 14.3 05/07/2020   HCT 41.6 05/07/2020   MCV 89.5 05/07/2020   PLT 284 05/07/2020    Lab Results  Component Value Date   CREATININE 0.91 05/07/2020   BUN 9 05/07/2020   NA 139 05/07/2020   K 4.4 05/07/2020   CL 103 05/07/2020    CO2 28 05/07/2020    Lab Results  Component Value Date   ALT 13 05/07/2020   AST 14 05/07/2020   ALKPHOS 73 09/15/2017   BILITOT 0.5 05/07/2020    Lab Results  Component Value Date   CHOL 114 05/07/2020   HDL 44 05/07/2020   LDLCALC 58 05/07/2020   TRIG 50 05/07/2020   CHOLHDL 2.6 05/07/2020   Lab Results  Component Value Date   LABRPR NON-REACTIVE 05/07/2020   HIV 1 RNA Quant (copies/mL)  Date Value  05/07/2020 8,050 (H)  06/12/2018 21 (H)  11/27/2017 <20 NOT DETECTED   CD4 T Cell Abs (/uL)  Date Value  05/07/2020 505  06/12/2018 690  11/27/2017 620     Problem List Items Addressed This Visit      High   HIV (human immunodeficiency virus infection) (HCC)    I encouraged him to always let us know immediately if he is in danger of running out of his medication.  I will repeat his lab work today and see him back in 1 month.      Relevant Orders   T-helper cell (CD4)- ([facility] clinic only)   HIV-1 RNA quant-no reflex-bld   Diarrhea    I will check a GI pathogen panel today.      Relevant Orders   Gastrointestinal Pathogen Panel PCR     Medium   Depression    I will have him meet with our behavioral health counselor.           Cliffton Asters, MD [facility] for Infectious Disease [facility] [phone] pager   [phone] cell 09/03/2020, 9:35 AM

## 2020-09-03 NOTE — Assessment & Plan Note (Signed)
I will have him meet with our behavioral health counselor.

## 2020-09-03 NOTE — Assessment & Plan Note (Signed)
I encouraged him to always let us know immediately if he is in danger of running out of his medication.  I will repeat his lab work today and see him back in 1 month.

## 2020-09-03 NOTE — Assessment & Plan Note (Signed)
I will check a GI pathogen panel today.

## 2020-09-04 LAB — T-HELPER CELL (CD4) - (RCID CLINIC ONLY)
CD4 % Helper T Cell: 24 % — ABNORMAL LOW (ref 33–65)
CD4 T Cell Abs: 647 /uL (ref 400–1790)

## 2020-09-06 LAB — HIV-1 RNA QUANT-NO REFLEX-BLD
HIV 1 RNA Quant: 12400 Copies/mL — ABNORMAL HIGH
HIV-1 RNA Quant, Log: 4.09 Log cps/mL — ABNORMAL HIGH

## 2020-09-07 ENCOUNTER — Other Ambulatory Visit: Payer: Self-pay

## 2020-09-07 DIAGNOSIS — B20 Human immunodeficiency virus [HIV] disease: Secondary | ICD-10-CM

## 2020-09-07 MED ORDER — TRIUMEQ 600-50-300 MG PO TABS: 1.0000 | ORAL_TABLET | Freq: Every day | ORAL | 0 refills | Status: DC

## 2020-09-10 ENCOUNTER — Ambulatory Visit: Payer: Self-pay

## 2020-10-01 ENCOUNTER — Ambulatory Visit: Payer: Self-pay | Admitting: Internal Medicine

## 2020-10-04 ENCOUNTER — Other Ambulatory Visit: Payer: Self-pay | Admitting: Internal Medicine

## 2020-10-04 DIAGNOSIS — B20 Human immunodeficiency virus [HIV] disease: Secondary | ICD-10-CM

## 2020-10-05 ENCOUNTER — Telehealth: Payer: Self-pay

## 2020-10-05 NOTE — Telephone Encounter (Signed)
We received a refill request for Triumeq for the patient. Per Dr. Orvan Falconer he wanted the patient to return in 1 month from his last visit (09/03/20). I have reached out to the patient to get him scheduled for a follow up and patient states he has moved to [place]. Patient states he is in the process of trying to find a new ID clinic in [place] and will let us know when he finds one. He also stated he may be back in town towards the end of this month and may be able to make a follow up with Dr. Orvan Falconer if he has not established with a new ID clinic by then. Patient would still like a 30 day supply of Triumeq sent to his pharmacy for them to mail it to him, so he does not run out. Refill has been sent.  Jeff Daniel

## 2020-11-02 ENCOUNTER — Telehealth: Payer: Self-pay | Admitting: *Deleted

## 2020-11-02 DIAGNOSIS — B20 Human immunodeficiency virus [HIV] disease: Secondary | ICD-10-CM

## 2020-11-02 MED ORDER — TRIUMEQ 600-50-300 MG PO TABS: 1.0000 | ORAL_TABLET | Freq: Every day | ORAL | 0 refills | Status: DC

## 2020-11-02 NOTE — Telephone Encounter (Signed)
Patient is in the process of moving to [place], [place]. He will be back in [place] in 2 weeks for 1 last appointment with Dr Orvan Falconer 1/18 at 2:15.  He will sign release of information and let us know who his new provider is so that we can send last office visit, labs, medication list and immunizations to help him establish care. Patient needed 1 last refill of triumeq, sent to [facility]. Andree Coss, RN

## 2020-11-12 ENCOUNTER — Other Ambulatory Visit: Payer: Self-pay

## 2020-11-12 ENCOUNTER — Ambulatory Visit (INDEPENDENT_AMBULATORY_CARE_PROVIDER_SITE_OTHER): Payer: Self-pay | Admitting: Internal Medicine

## 2020-11-12 ENCOUNTER — Encounter: Payer: Self-pay | Admitting: Internal Medicine

## 2020-11-12 ENCOUNTER — Telehealth: Payer: Self-pay

## 2020-11-12 DIAGNOSIS — R197 Diarrhea, unspecified: Secondary | ICD-10-CM

## 2020-11-12 DIAGNOSIS — Z21 Asymptomatic human immunodeficiency virus [HIV] infection status: Secondary | ICD-10-CM

## 2020-11-12 DIAGNOSIS — F330 Major depressive disorder, recurrent, mild: Secondary | ICD-10-CM

## 2020-11-12 NOTE — Telephone Encounter (Signed)
[facility] Patient Advocate Encounter ? ?Insurance verification completed.   ? ?The patient is uninsured and will need patient assistance for medication. ? ?We can complete the application and will need to meet with the patient for signatures and income documentation. ? ?Eleazar Kimmey, CPhT ?Specialty Pharmacy Patient Advocate ?[facility] ?Phone: [phone] ?Fax:  [phone]  ?

## 2020-11-12 NOTE — Assessment & Plan Note (Signed)
His adherence with Triumeq has improved over the last 2 months.  I am hopeful that his virus is now suppressing.  He will continue Triumeq and get repeat lab work today prior to moving to [place].  We will forward records to his clinic there.  I encouraged him to get his second COVID-vaccine as soon as possible.

## 2020-11-12 NOTE — Assessment & Plan Note (Signed)
His transient diarrhea was probably related to HIV and is improving back on consistent Triumeq therapy.

## 2020-11-12 NOTE — Progress Notes (Signed)
Patient Active Problem List   Diagnosis Date Noted  . Diarrhea 09/03/2020    Priority: High  . 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 HIV (human immunodeficiency virus infection) (HCC) 05/04/2016    Priority: High  . Depression 07/12/2016    Priority: Medium  . Outbursts of anger 07/12/2016    Priority: Medium  . Penile rash 12/26/2019  . Hematochezia 12/26/2019  . Attention and concentration deficit 09/23/2019  . Current smoker 11/27/2017  . Onychomycosis 02/20/2017  . Nephrolithiasis 08/22/2016  . Chronic cough 04/29/2016  . High risk sexual behavior 04/28/2016  . Seasonal allergies 03/25/2015    Patient's Medications  New Prescriptions   No medications on file  Previous Medications   ABACAVIR-DOLUTEGRAVIR-LAMIVUDINE (TRIUMEQ) 600-50-300 MG TABLET    Take 1 tablet by mouth daily.  Modified Medications   No medications on file  Discontinued Medications   No medications on file    Subjective: Jeff Daniel is in for his first visit in 2 years.  To school insurance for month this past summer and was off of his Triumeq.  He thinks he was doing a good job taking his medication consistently when he had a supply.    When he was here to see me in early November he said that he had been back on it now for several months and has not missed any doses.  He now tells me that he does not recall missing doses prior to that visit.  He says that he does not think he has missed any doses since that last visit.  He has taken his first Pfizer vaccine.  He was feeling more anxious and depressed and was having watery diarrhea but says that all of that has improved since last visit.  He will be moving to [place], [place] this coming weekend.  He will be living with family and returning to school.  He is looking forward to the move.  Review of Systems: Review of Systems  Constitutional: Negative for fever and weight loss.  Respiratory: Negative for cough and shortness  of breath.   Cardiovascular: Negative for chest pain.  Gastrointestinal: Negative for abdominal pain, diarrhea, nausea and vomiting.  Genitourinary: Negative for dysuria.  Skin: Negative for rash.  Psychiatric/Behavioral: Negative for depression. The patient is not nervous/anxious.     Past Medical History:  Diagnosis Date  . HIV (human immunodeficiency virus infection) (HCC)     Social History   Tobacco Use  . Smoking status: Former Smoker    Types: Cigars  . Smokeless tobacco: Never Used  Vaping Use  . Vaping Use: Some days  Substance Use Topics  . Alcohol use: Yes  . Drug use: Yes    Types: Marijuana    Family History  Problem Relation Age of Onset  . Hypertension Mother   . Hypertension Maternal Grandmother   . Diabetes Maternal Grandmother   . Hypertension Maternal Grandfather   . Diabetes Maternal Grandfather   . Hypertension Paternal Grandmother   . Diabetes Paternal Grandmother   . Hypertension Paternal Grandfather   . Diabetes Paternal Grandfather     Allergies  Allergen Reactions  . Pollen Extract     Health Maintenance  Topic Date Due  . COVID-19 Vaccine (1) Never done  . TETANUS/TDAP  02/10/2027  . INFLUENZA VACCINE  Completed  . Hepatitis C Screening  Completed    Objective:  Vitals:   11/12/20 1029  Weight: 150 lb (68  kg)   Body mass index is 23.49 kg/m.  Physical Exam Constitutional:      Comments: He is more talkative and in better spirits today.  He has gained 7 pounds since his last visit.  Cardiovascular:     Rate and Rhythm: Normal rate and regular rhythm.     Heart sounds: No murmur heard.   Pulmonary:     Effort: Pulmonary effort is normal.     Breath sounds: Normal breath sounds.  Abdominal:     Palpations: Abdomen is soft.     Tenderness: There is no abdominal tenderness.  Musculoskeletal:        General: No swelling or tenderness.  Skin:    Findings: No rash.  Neurological:     General: No focal deficit present.   Psychiatric:        Mood and Affect: Mood normal.     Lab Results Lab Results  Component Value Date   WBC 7.4 05/07/2020   HGB 14.3 05/07/2020   HCT 41.6 05/07/2020   MCV 89.5 05/07/2020   PLT 284 05/07/2020    Lab Results  Component Value Date   CREATININE 0.91 05/07/2020   BUN 9 05/07/2020   NA 139 05/07/2020   K 4.4 05/07/2020   CL 103 05/07/2020   CO2 28 05/07/2020    Lab Results  Component Value Date   ALT 13 05/07/2020   AST 14 05/07/2020   ALKPHOS 73 09/15/2017   BILITOT 0.5 05/07/2020    Lab Results  Component Value Date   CHOL 114 05/07/2020   HDL 44 05/07/2020   LDLCALC 58 05/07/2020   TRIG 50 05/07/2020   CHOLHDL 2.6 05/07/2020   Lab Results  Component Value Date   LABRPR NON-REACTIVE 05/07/2020   HIV 1 RNA Quant  Date Value  09/03/2020 12,400 Copies/mL (H)  05/07/2020 8,050 copies/mL (H)  06/12/2018 21 copies/mL (H)   CD4 T Cell Abs (/uL)  Date Value  09/03/2020 647  05/07/2020 505  06/12/2018 690     Problem List Items Addressed This Visit      High   HIV (human immunodeficiency virus infection) (HCC)    His adherence with Triumeq has improved over the last 2 months.  I am hopeful that his virus is now suppressing.  He will continue Triumeq and get repeat lab work today prior to moving to [place].  We will forward records to his clinic there.  I encouraged him to get his second COVID-vaccine as soon as possible.      Relevant Orders   HIV-1 RNA quant-no reflex-bld   HIV-1 genotypr plus   HIV-1 Integrase Genotype   Diarrhea    His transient diarrhea was probably related to HIV and is improving back on consistent Triumeq therapy.        Medium   Depression    He has depression and anxiety are improving with better care for his HIV infection and a plan to move back with family in [place].           Cliffton Asters, MD [facility] for Infectious Disease [facility] [phone] pager   [phone]  cell 11/12/2020, 10:45 AM

## 2020-11-12 NOTE — Assessment & Plan Note (Signed)
He has depression and anxiety are improving with better care for his HIV infection and a plan to move back with family in [place].

## 2020-11-17 ENCOUNTER — Ambulatory Visit: Payer: Self-pay | Admitting: Internal Medicine

## 2020-11-25 LAB — HIV-1 INTEGRASE GENOTYPE

## 2020-11-25 LAB — HIV-1 RNA QUANT-NO REFLEX-BLD
HIV 1 RNA Quant: 32 Copies/mL — ABNORMAL HIGH
HIV-1 RNA Quant, Log: 1.5 Log cps/mL — ABNORMAL HIGH

## 2020-11-25 LAB — HIV-1 GENOTYPE: HIV-1 Genotype: DETECTED — AB

## 2020-12-31 ENCOUNTER — Other Ambulatory Visit: Payer: Self-pay | Admitting: Internal Medicine

## 2020-12-31 ENCOUNTER — Telehealth: Payer: Self-pay

## 2020-12-31 DIAGNOSIS — B20 Human immunodeficiency virus [HIV] disease: Secondary | ICD-10-CM

## 2020-12-31 NOTE — Telephone Encounter (Signed)
Called patient regarding refill request for Triumeq. Per last note patient is supposed to locate local provider after he moves to [place]. Spoke with patient who states that he has not located a new provider yet.  Informed patient that office can send in 30 day supply to pharmacy, but he must be seen by new provider to take over refills. Patient will try to schedule appointment next week with provider.  Will contact office for records.

## 2021-08-20 NOTE — Assessment & Plan Note (Signed)
Is doing better on his single pill regimen of Triumeq.

## 2022-04-05 ENCOUNTER — Encounter: Payer: Self-pay | Admitting: *Deleted

## 2022-08-10 ENCOUNTER — Ambulatory Visit: Payer: Self-pay | Admitting: Internal Medicine

## 2022-08-15 ENCOUNTER — Other Ambulatory Visit (HOSPITAL_COMMUNITY): Payer: Self-pay

## 2022-08-16 ENCOUNTER — Telehealth: Payer: Self-pay

## 2022-08-16 ENCOUNTER — Other Ambulatory Visit: Payer: Self-pay

## 2022-08-16 ENCOUNTER — Other Ambulatory Visit (HOSPITAL_COMMUNITY)
Admission: RE | Admit: 2022-08-16 | Discharge: 2022-08-16 | Disposition: A | Discharge status: Home / Self Care | Payer: BC Managed Care – PPO | Source: Ambulatory Visit | Attending: Internal Medicine | Admitting: Internal Medicine

## 2022-08-16 ENCOUNTER — Ambulatory Visit (INDEPENDENT_AMBULATORY_CARE_PROVIDER_SITE_OTHER): Payer: BC Managed Care – PPO | Admitting: Internal Medicine

## 2022-08-16 ENCOUNTER — Encounter: Payer: Self-pay | Admitting: Internal Medicine

## 2022-08-16 VITALS — BP 100/66 | HR 72 | Temp 97.7°F | Ht 67.0 in | Wt 135.0 lb

## 2022-08-16 DIAGNOSIS — Z113 Encounter for screening for infections with a predominantly sexual mode of transmission: Secondary | ICD-10-CM | POA: Insufficient documentation

## 2022-08-16 DIAGNOSIS — Z23 Encounter for immunization: Secondary | ICD-10-CM

## 2022-08-16 DIAGNOSIS — B20 Human immunodeficiency virus [HIV] disease: Secondary | ICD-10-CM | POA: Diagnosis present

## 2022-08-16 MED ORDER — DOLUTEGRAVIR-LAMIVUDINE 50-300 MG PO TABS: 1.0000 | ORAL_TABLET | Freq: Every day | ORAL | 1 refills | Status: DC

## 2022-08-16 NOTE — Patient Instructions (Signed)
You will get your 2nd of 4 hpv vaccine today, the next one is in 2 months, and the final one is in 6 months   Labs today (urine/blood) for std screening and hiv viral load testing   Start dovato today   See me in around 6 weeks.  Once viral load is controlled, we can consider cabenuva injection

## 2022-08-16 NOTE — Addendum Note (Signed)
Addended by: Leatrice Jewels on: 08/16/2022 02:29 PM   Modules accepted: Orders

## 2022-08-16 NOTE — Progress Notes (Signed)
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 male hiv previously seen at [facility] here to establish care  I reviewed outside record from [facility] Initial visit 08/16/22 with me #hiv Dx 2017 in [place]; msm; he is bisexual; no hx OI or prior oi prophylaxis Started on triumeq the same year he was diagnosed Previously well controlled; on 05/12/2022 last testing at [facility] viral load was 3100. Patient said he wasn't as consistent taking meds at that time Discussed newer options with him and he would like to go to dovato His hepatitis b testing showed immune status as of 05/11/22 (passive)  He also inquires about cabenuva. 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 Prior has lived in [place], [place] as well No smoking, drinking, substance use Not in a relationship but sexually active. Last sexual encounter 05/2022. Hx showed unprotected status at times but he reports using condoms as well He has a International aid/development worker and dog He has aunt/grandma/uncle/cousins living  here  Review of Systems: ROS All other ros negative      Past Medical History:  Diagnosis Date   HIV (human immunodeficiency virus infection) ([place])     Social History   Tobacco Use   Smoking status: Former    Types: Cigars   Smokeless tobacco: Never  Vaping Use   Vaping Use: Some days  Substance Use Topics   Alcohol use: Yes   Drug use: Yes    Types: Marijuana    Family History  Problem Relation Age of Onset   Hypertension Mother    Hypertension Maternal Grandmother    Diabetes Maternal Grandmother    Hypertension Maternal Grandfather    Diabetes Maternal Grandfather    Hypertension Paternal Grandmother    Diabetes Paternal Grandmother    Hypertension Paternal Grandfather    Diabetes Paternal Grandfather     Allergies  Allergen Reactions   Pollen Extract     OBJECTIVE: Vitals:   08/16/22 1019  BP: 100/66  Pulse: 72  Temp: 97.7 F (36.5 C)  TempSrc: Oral  SpO2: 96%  Weight: 135 lb (61.2 kg)  Height: 5\' 7"  (1.702 m)   Body mass index is 21.14 kg/m.  Physical exam: General/constitutional: no distress, pleasant HEENT: Normocephalic, PER, Conj Clear, EOMI, Oropharynx clear Neck supple CV: rrr no mrg Lungs: clear to auscultation, normal respiratory effort Abd: Soft, Nontender Ext: no edema Skin: No Rash Neuro: nonfocal MSK: no peripheral joint swelling/tenderness/warmth; back spines nontender   Lab:  HIV: 05/12/22         3100      /  10/2021            ud        /         715 (31%) 07/2021            ud  Microbiology:  Serology:  Imaging:   Assessment/plan: Problem List Items Addressed This Visit   None Visit Diagnoses     HIV disease ([place])    -  Primary   Relevant Medications   dolutegravir-lamiVUDine (DOVATO) 50-300 MG tablet   Other Relevant Orders   HIV 1 RNA quant-no reflex-bld   CBC   COMPLETE METABOLIC PANEL WITH GFR   HPV 9-valent vaccine,Recombinat (Completed)   Urine cytology ancillary only   Screening for STDs  (sexually transmitted diseases)       Relevant Orders   RPR   Fluorescent treponemal ab(fta)-IgG-bld   Cytology (oral, anal, urethral) ancillary only   Cytology (oral, anal, urethral) ancillary only   Urine cytology ancillary only         #hiv Bisexual/msm transmission Prior regimen triumeq, well controlled but some compliance issue around 04/2022 with viral load then 3100  Discussed other tx options including biktarvy, dovato, and cabenuva  Patient opted for dovato now but interested also in cabenuva, which I discussed once viral load controlled will review insurance approval of that  I have no prior genotype from [facility] (but he was controlled on triumeq prior to establishing care with them in 07/2021)   -discussed u=u -encourage compliance -start HIV medication dovato; 60 day supply rx'ed to his pharmacy -labs today -f/u in 6 weeks   #hcm -vaccine Hpv 04/2022 first shot; 2nd of 3 shots today; 3rd shot to be given 3 months from now Tdap 04/2022 Hep b (heplisav) 07/28/21 and 11/24/21 Pneumococcal 20 10/2021 -hepatitis Hep a Immune 04/2012 hep b sAb 380s 11/2011 hep c ab nonreactive -std Said he never had std prior outside of hiv (08/16/22 history) 10/2021 RPR nonreactive      Follow-up: Return in about 6 weeks (around 09/27/2022).   I have spent a total of 65 minutes of face-to-face and non-face-to-face time, excluding clinical staff time, preparing to see patient, ordering tests and/or medications, and provide counseling the patient    Jabier Mutton, [facility] for [facility] 08/16/2022, 10:18 AM

## 2022-08-16 NOTE — Telephone Encounter (Signed)
Called patient to see if he would be able to make it to today's appointment, no answer. Left HIPAA compliant voicemail requesting callback.   Baron Parmelee D Lynzie Cliburn, RN  

## 2022-08-16 NOTE — Addendum Note (Signed)
Addended by: Leatrice Jewels on: 08/16/2022 02:28 PM   Modules accepted: Orders

## 2022-08-17 LAB — URINE CYTOLOGY ANCILLARY ONLY
Chlamydia: NEGATIVE
Comment: NEGATIVE
Comment: NORMAL
Neisseria Gonorrhea: NEGATIVE

## 2022-08-17 LAB — CYTOLOGY, (ORAL, ANAL, URETHRAL) ANCILLARY ONLY
Chlamydia: NEGATIVE
Chlamydia: NEGATIVE
Comment: NEGATIVE
Comment: NEGATIVE
Comment: NORMAL
Comment: NORMAL
Neisseria Gonorrhea: NEGATIVE
Neisseria Gonorrhea: NEGATIVE

## 2022-08-19 LAB — CBC
HCT: 44.2 % (ref 38.5–50.0)
Hemoglobin: 15.3 g/dL (ref 13.2–17.1)
MCH: 31.2 pg (ref 27.0–33.0)
MCHC: 34.6 g/dL (ref 32.0–36.0)
MCV: 90.2 fL (ref 80.0–100.0)
MPV: 11 fL (ref 7.5–12.5)
Platelets: 242 10*3/uL (ref 140–400)
RBC: 4.9 10*6/uL (ref 4.20–5.80)
RDW: 11.5 % (ref 11.0–15.0)
WBC: 4.4 10*3/uL (ref 3.8–10.8)

## 2022-08-19 LAB — COMPLETE METABOLIC PANEL WITH GFR
AG Ratio: 1.6 (calc) (ref 1.0–2.5)
ALT: 30 U/L (ref 9–46)
AST: 21 U/L (ref 10–40)
Albumin: 4.5 g/dL (ref 3.6–5.1)
Alkaline phosphatase (APISO): 68 U/L (ref 36–130)
BUN: 11 mg/dL (ref 7–25)
CO2: 30 mmol/L (ref 20–32)
Calcium: 9.2 mg/dL (ref 8.6–10.3)
Chloride: 107 mmol/L (ref 98–110)
Creat: 0.88 mg/dL (ref 0.60–1.24)
Globulin: 2.8 g/dL (calc) (ref 1.9–3.7)
Glucose, Bld: 79 mg/dL (ref 65–99)
Potassium: 4.8 mmol/L (ref 3.5–5.3)
Sodium: 142 mmol/L (ref 135–146)
Total Bilirubin: 0.4 mg/dL (ref 0.2–1.2)
Total Protein: 7.3 g/dL (ref 6.1–8.1)
eGFR: 123 mL/min/{1.73_m2} (ref >=60)

## 2022-08-19 LAB — RPR: RPR Ser Ql: NONREACTIVE

## 2022-08-19 LAB — HIV-1 RNA QUANT-NO REFLEX-BLD
HIV 1 RNA Quant: 1560 Copies/mL — ABNORMAL HIGH
HIV-1 RNA Quant, Log: 3.19 Log cps/mL — ABNORMAL HIGH

## 2022-09-28 ENCOUNTER — Other Ambulatory Visit: Payer: Self-pay

## 2022-09-28 ENCOUNTER — Encounter: Payer: Self-pay | Admitting: Internal Medicine

## 2022-09-28 ENCOUNTER — Ambulatory Visit (INDEPENDENT_AMBULATORY_CARE_PROVIDER_SITE_OTHER): Payer: BC Managed Care – PPO | Admitting: Internal Medicine

## 2022-09-28 ENCOUNTER — Ambulatory Visit: Payer: BC Managed Care – PPO

## 2022-09-28 VITALS — BP 109/71 | HR 82 | Temp 98.9°F | Ht 67.0 in | Wt 140.0 lb

## 2022-09-28 DIAGNOSIS — B20 Human immunodeficiency virus [HIV] disease: Secondary | ICD-10-CM | POA: Diagnosis not present

## 2022-09-28 MED ORDER — DOLUTEGRAVIR-LAMIVUDINE 50-300 MG PO TABS: 1.0000 | ORAL_TABLET | Freq: Every day | ORAL | 11 refills | Status: DC

## 2022-09-28 NOTE — Patient Instructions (Signed)
Check hiv lab today   Continue dovato  See [place] in 3 months

## 2022-09-28 NOTE — Progress Notes (Signed)
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 male hiv previously seen at [facility] here to establish care. He is here today for f/u  11/29 id f/u Compliant with meds Going to [place] for vacation soon Sexually active -- no std sx Doing well with dovato; compliant no missed dose  -----------  I reviewed outside record from [facility] Initial visit 08/16/22 with me #hiv Dx 2017 in [place]; msm; he is bisexual; no hx OI or prior oi prophylaxis Started on triumeq the same year he was diagnosed Previously well controlled; on 05/12/2022 last testing at [facility] viral load was 3100. Patient said he wasn't as consistent taking meds at that time Discussed newer options with him and he would like to go to dovato His hepatitis b testing showed immune status as of 05/11/22 (passive)  He also inquires about cabenuva. 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 Prior has lived in [place], [place] as well No smoking, drinking, substance use Not in a relationship but sexually active. Last sexual encounter 05/2022. Hx showed unprotected status at times but he  reports using condoms as well He has a Emergency planning/management officer and dog He has aunt/grandma/uncle/cousins living here  Review of Systems: ROS All other ros negative      Past Medical History:  Diagnosis Date   HIV (human immunodeficiency virus infection) (HCC)     Social History   Tobacco Use   Smoking status: Every Day    Types: E-cigarettes   Smokeless tobacco: Never  Vaping Use   Vaping Use: Some days  Substance Use Topics   Alcohol use: Yes    Comment: occasional   Drug use: Not Currently    Types: Marijuana    Family History  Problem Relation Age of Onset   Hypertension Mother    Hypertension Maternal Grandmother    Diabetes Maternal Grandmother    Hypertension Maternal Grandfather    Diabetes Maternal Grandfather    Hypertension Paternal Grandmother    Diabetes Paternal Grandmother    Hypertension Paternal Grandfather    Diabetes Paternal Grandfather     Allergies  Allergen Reactions   Pollen Extract     OBJECTIVE: Vitals:   09/28/22 1424  BP: 109/71  Pulse: 82  Temp: 98.9 F (37.2 C)  TempSrc: Oral  SpO2: 100%  Weight: 140 lb (63.5 kg)  Height: 5\' 7"  (1.702 m)   Body mass index is 21.93 kg/m.  Physical exam: General/constitutional: no distress, pleasant HEENT: Normocephalic, PER, Conj Clear, EOMI, Oropharynx clear Neck supple CV: rrr no mrg Lungs: clear to auscultation, normal respiratory effort Abd: Soft, Nontender Ext: no edema Skin: No Rash  Neuro: nonfocal MSK: no peripheral joint swelling/tenderness/warmth; back spines nontender    Lab:  HIV: 05/12/22         3100      / 10/2021            ud        /         715 (31%) 07/2021            ud  Microbiology:  Serology:  Imaging:   Assessment/plan: Problem List Items Addressed This Visit   None Visit Diagnoses     HIV disease (HCC)    -  Primary   Relevant Medications   dolutegravir-lamiVUDine (DOVATO) 50-300 MG tablet   Other Relevant Orders   HIV 1 RNA quant-no reflex-bld         #hiv Bisexual/msm transmission Prior regimen triumeq, well controlled but some compliance issue around 04/2022 with viral load then 3100  Discussed other tx options including biktarvy, dovato, and cabenuva  Patient opted for dovato now but interested also in cabenuva, which I discussed once viral load controlled will review insurance approval of that  I have no prior genotype from [facility] (but he was controlled on triumeq prior to establishing care with them in 07/2021)  Patient will go to [place] for visit christmast/new year and would like enough medication. He has had no missed dose last 4 weeks    -discussed u=u -encourage compliance -continue current HIV medication with dovato -labs hiv rna today -renew rx for a year of dovato 30 day supply at a time -f/u in 3 months    #hcm -vaccine Hpv vaccines completed 2018 Tdap 04/2022 Hep b (heplisav) 07/28/21 and 11/24/21 Pneumococcal 20 10/2021 Doesn't want flu shot or covid booster  -hepatitis Hep a Immune 04/2012 hep b sAb 380s 11/2011 hep c ab nonreactive -std Said he never had std prior outside of hiv (08/16/22 history) - negative triple screen and rpr 07/2022; defer testing today 09/28/22 10/2021 RPR nonreactive      Follow-up: Return in about 3 months (around 12/29/2022).   I have spent a total of 65 minutes of face-to-face and non-face-to-face time, excluding clinical staff time, preparing to see patient, ordering tests and/or medications, and provide counseling the patient    Raymondo Band, MD [facility] 09/28/2022, 2:52 PM

## 2022-10-01 LAB — HIV-1 RNA QUANT-NO REFLEX-BLD
HIV 1 RNA Quant: <20 Copies/mL — ABNORMAL HIGH
HIV-1 RNA Quant, Log: <1.3 Log cps/mL — ABNORMAL HIGH

## 2022-12-01 ENCOUNTER — Encounter: Payer: Self-pay | Admitting: Internal Medicine

## 2022-12-02 ENCOUNTER — Other Ambulatory Visit (HOSPITAL_COMMUNITY): Payer: Self-pay

## 2022-12-02 NOTE — Telephone Encounter (Signed)
I am not finding coverage for him I will call him and see what kind of insurance he have?

## 2022-12-05 ENCOUNTER — Encounter: Payer: Self-pay | Admitting: Internal Medicine

## 2022-12-05 ENCOUNTER — Other Ambulatory Visit: Payer: Self-pay | Admitting: Pharmacist

## 2022-12-05 ENCOUNTER — Emergency Department (HOSPITAL_COMMUNITY)
Admission: EM | Admit: 2022-12-05 | Discharge: 2022-12-05 | Disposition: A | Discharge status: Home / Self Care | Payer: Medicaid - Out of State | Attending: Emergency Medicine | Admitting: Emergency Medicine

## 2022-12-05 ENCOUNTER — Other Ambulatory Visit: Payer: Self-pay

## 2022-12-05 DIAGNOSIS — Z21 Asymptomatic human immunodeficiency virus [HIV] infection status: Secondary | ICD-10-CM | POA: Diagnosis not present

## 2022-12-05 DIAGNOSIS — Y906 Blood alcohol level of 120-199 mg/100 ml: Secondary | ICD-10-CM | POA: Diagnosis not present

## 2022-12-05 DIAGNOSIS — F10921 Alcohol use, unspecified with intoxication delirium: Secondary | ICD-10-CM | POA: Insufficient documentation

## 2022-12-05 DIAGNOSIS — B20 Human immunodeficiency virus [HIV] disease: Secondary | ICD-10-CM

## 2022-12-05 DIAGNOSIS — F17290 Nicotine dependence, other tobacco product, uncomplicated: Secondary | ICD-10-CM | POA: Insufficient documentation

## 2022-12-05 LAB — ETHANOL: Alcohol, Ethyl (B): 199 mg/dL — ABNORMAL HIGH (ref <10)

## 2022-12-05 MED ORDER — DOVATO 50-300 MG PO TABS: 1.0000 | ORAL_TABLET | Freq: Every day | ORAL | 0 refills | Status: AC

## 2022-12-05 MED ORDER — ZIPRASIDONE MESYLATE 20 MG IM SOLR
20.0000 mg | Freq: Once | INTRAMUSCULAR | Status: AC
  Administered 2022-12-05: 20 mg via INTRAMUSCULAR

## 2022-12-05 MED ORDER — SODIUM CHLORIDE 0.9 % IV BOLUS
1000.0000 mL | Freq: Once | INTRAVENOUS | Status: AC
  Administered 2022-12-05: 1000 mL via INTRAVENOUS

## 2022-12-05 NOTE — ED Triage Notes (Signed)
Pt bib by [employer] from home. Pt found intoxicated. Upon arrival pt screaming, agitated and non complient. See MAR for medication intervention. Medical clearance pending.

## 2022-12-05 NOTE — ED Provider Notes (Signed)
[place] [facility] DEPT Provider Note: Georgena Spurling, MD, FACEP  CSN: [id number] MRN: [id number] ARRIVAL: 12/05/22 at [place]: [place]  Alcohol Intoxication  Level 5 caveat: Intoxicated; uncooperative HISTORY OF PRESENT ILLNESS  12/05/22 1:16 AM Jeff Daniel is a 27 y.o. male whose family states he was drinking heavily over the past several hours.  He was found about 6 blocks away from his house.  He refused to return home so police took him to jail where he became belligerent and combative and jail staff refused to accept him.  On arrival he is screaming obscenities and thrashing all of his extremities but without overt attempts to hurt others.  He will not answer questions.   Past Medical History:  Diagnosis Date   HIV (human immunodeficiency virus infection) ([place])     Past Surgical History:  Procedure Laterality Date   NO PAST SURGERIES      Family History  Problem Relation Age of Onset   Hypertension Mother    Hypertension Maternal Grandmother    Diabetes Maternal Grandmother    Hypertension Maternal Grandfather    Diabetes Maternal Grandfather    Hypertension Paternal Grandmother    Diabetes Paternal Grandmother    Hypertension Paternal Grandfather    Diabetes Paternal Grandfather     Social History   Tobacco Use   Smoking status: Every Day    Types: E-cigarettes   Smokeless tobacco: Never  Vaping Use   Vaping Use: Some days  Substance Use Topics   Alcohol use: Yes    Comment: occasional   Drug use: Not Currently    Types: Marijuana    Prior to Admission medications   Medication Sig Start Date End Date Taking? Authorizing Provider  dolutegravir-lamiVUDine (DOVATO) 50-300 MG tablet Take 1 tablet by mouth daily. 09/28/22 09/23/23  Prudencio Pair T, MD    Allergies Pollen extract   REVIEW OF SYSTEMS  Negative except as noted here or in the History of Present Illness.   PHYSICAL EXAMINATION  Initial Vital Signs Blood  pressure 107/60, pulse 94, temperature 98.8 F (37.1 C), temperature source Oral, resp. rate 12, SpO2 93 %.  Examination General: Well-developed, well-nourished male in no respiratory distress; appearance consistent with age of record HENT: normocephalic; atraumatic Eyes: Normal appearance Neck: supple Heart: regular rate and rhythm Lungs: No respiratory effort and excursion Abdomen: soft; nondistended Extremities: No deformity; full range of motion Neurologic: Awake, alert, appears intoxicated; noted to move all extremities Skin: Warm and dry Psychiatric: Combative; uncooperative   RESULTS  Summary of this visit's results, reviewed and interpreted by myself:   EKG Interpretation  Date/Time:    Ventricular Rate:    PR Interval:    QRS Duration:   QT Interval:    QTC Calculation:   R Axis:     Text Interpretation:         Laboratory Studies: Results for orders placed or performed during the hospital encounter of 12/05/22 (from the past 24 hour(s))  Ethanol     Status: Abnormal   Collection Time: 12/05/22  1:52 AM  Result Value Ref Range   Alcohol, Ethyl (B) 199 (H) <10 mg/dL   Imaging Studies: No results found.  ED COURSE and MDM  Nursing notes, initial and subsequent vitals signs, including pulse oximetry, reviewed and interpreted by myself.  Vitals:   12/05/22 0141 12/05/22 0430 12/05/22 0530  BP: 107/60 (!) 94/47 (!) 99/58  Pulse: 94 84 80  Resp: 12 19 (!) 24  Temp: 98.8 F (37.1 C)    TempSrc: Oral    SpO2: 93% 95% 95%   Medications  ziprasidone (GEODON) injection 20 mg (20 mg Intramuscular Given 12/05/22 0119)  sodium chloride 0.9 % bolus 1,000 mL (0 mLs Intravenous Stopped 12/05/22 0444)   2:08 AM Patient given Geodon on arrival due to agitation presenting threat to himself and others.  Patient now resting comfortably.  6:41 AM Patient now awake and cooperative.  He appears stable for discharge.   PROCEDURES  Procedures   ED DIAGNOSES      ICD-10-CM   1. Alcohol intoxication with delirium ([place])  F10.921          Shanon Rosser, MD 12/05/22 [phone]

## 2022-12-05 NOTE — Progress Notes (Signed)
Medication Samples have been provided to the patient.  Drug name: Dovato        Strength: 50/300 mg         Qty: 28 tablets (2 bottle)   LOT: [id number]   Exp.Date: 01/2024  Dosing instructions: Take one tablet by mouth once daily  The patient has been instructed regarding the correct time, dose, and frequency of taking this medication, including desired effects and most common side effects.   Masahiro Iglesia L. Eber Hong, PharmD, BCIDP, AAHIVP, CPP Clinical Pharmacist Practitioner Infectious Diseases [facility] 10/12/2020, 10:07 AM

## 2022-12-08 ENCOUNTER — Other Ambulatory Visit: Payer: Self-pay

## 2022-12-08 MED ORDER — DOLUTEGRAVIR-LAMIVUDINE 50-300 MG PO TABS: 1.0000 | ORAL_TABLET | Freq: Every day | ORAL | 5 refills | Status: AC

## 2022-12-29 ENCOUNTER — Encounter: Payer: Self-pay | Admitting: Internal Medicine

## 2022-12-29 ENCOUNTER — Other Ambulatory Visit: Payer: Self-pay

## 2022-12-29 ENCOUNTER — Ambulatory Visit: Payer: Self-pay

## 2022-12-29 ENCOUNTER — Ambulatory Visit (INDEPENDENT_AMBULATORY_CARE_PROVIDER_SITE_OTHER): Payer: Medicaid - Out of State | Admitting: Internal Medicine

## 2022-12-29 VITALS — BP 124/81 | HR 73 | Temp 98.0°F | Ht 66.0 in | Wt 146.0 lb

## 2022-12-29 DIAGNOSIS — B20 Human immunodeficiency virus [HIV] disease: Secondary | ICD-10-CM | POA: Diagnosis not present

## 2022-12-29 DIAGNOSIS — Z113 Encounter for screening for infections with a predominantly sexual mode of transmission: Secondary | ICD-10-CM

## 2022-12-29 NOTE — Patient Instructions (Signed)
Will send your chart to our pharmacy team to discuss cabenuva again   Follow up with me in 6 months  Continue dovato for now until you transition over to cabenuva

## 2022-12-29 NOTE — Progress Notes (Addendum)
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 male hiv previously seen at [facility] here to establish care. 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 Will be done 03/11/23. Has been looking for job Compliant with meds dovato. No missed dose last 4 weeks He does want std testing -- no symptoms Sexually active No substance use still   11/29 id f/u Compliant with meds Going to [place] for vacation soon Sexually active -- no std sx Doing well with dovato; compliant no missed dose  -----------  I reviewed outside record from [facility] Initial visit 08/16/22 with me #hiv Dx 2017 in [place]; msm; he is bisexual; no hx OI or prior oi prophylaxis Started on triumeq the same year he was diagnosed Previously well controlled; on 05/12/2022 last testing at [facility] viral load was 3100. Patient said he wasn't as consistent taking meds at that time Discussed newer options with him and he would like to go to dovato His hepatitis b testing showed immune status as of 05/11/22 (passive)  He also inquires about cabenuva. 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 Prior has lived in [place], [place] as well No smoking, drinking, substance use Not in a relationship but sexually active. Last sexual encounter 05/2022. Hx showed unprotected status at times but he reports using condoms as well He has a International aid/development worker and dog He has aunt/grandma/uncle/cousins living here  Review of Systems: ROS All other ros negative      Past Medical History:  Diagnosis Date   HIV (human immunodeficiency virus infection) ([place])     Social History   Tobacco Use   Smoking status: Every Day    Types: E-cigarettes   Smokeless tobacco: Never  Vaping Use   Vaping Use: Some days  Substance Use Topics   Alcohol use: Yes    Comment: occasional   Drug use: Not Currently    Types: Marijuana    Family History  Problem Relation Age of Onset   Hypertension Mother    Hypertension Maternal Grandmother    Diabetes Maternal Grandmother    Hypertension Maternal Grandfather    Diabetes Maternal Grandfather    Hypertension Paternal Grandmother    Diabetes Paternal Grandmother    Hypertension Paternal Grandfather    Diabetes Paternal Grandfather     Allergies  Allergen Reactions   Pollen Extract     OBJECTIVE: There were no vitals filed for this visit.  There is no height or weight on file to calculate BMI.  Physical exam: General/constitutional: no distress, pleasant HEENT: Normocephalic, PER, Conj Clear, EOMI, Oropharynx clear Neck  supple CV: rrr no mrg Lungs: clear to auscultation, normal respiratory effort Abd: Soft, Nontender Ext: no edema Skin: No Rash Neuro: nonfocal MSK: no peripheral joint swelling/tenderness/warmth; back spines nontender     Lab:  HIV: 08/2022          UD 05/12/22         3100      / 10/2021            ud        /         715 (31%) 07/2021            ud  Microbiology:  Serology:  Imaging:   Assessment/plan: Problem List Items Addressed This Visit    None   #hiv Bisexual/msm transmission Prior regimen triumeq, well controlled but some compliance issue around 04/2022 with viral load then 3100  Discussed other tx options including biktarvy, dovato, and cabenuva  Patient opted for dovato now but interested also in cabenuva, which I discussed once viral load controlled will review insurance approval of that  I have no prior genotype from [facility] (but he was controlled on triumeq prior to establishing care with them in 07/2021)  Patient will go to [place] for visit christmast/new year and would like enough medication. He has had no missed dose last 4 weeks  12/29/22 doing well. Wants to rediscuss cabenuva once he is undetectable     -discussed u=u -encourage compliance -continue current HIV medication -labs today -f/u in 6 months -will forward note to pharmacy team about cabenuva. I think it would be reasonable now to start cabenuva for him     #hcm -vaccine Hpv vaccines completed 2018 Tdap 04/2022 Hep b (heplisav) 07/28/21 and 11/24/21 Pneumococcal 20 10/2021 Doesn't want flu shot or covid booster -hepatitis Hep a Immune 04/2012 hep b sAb 380s 11/2011 hep c ab nonreactive -std Said he never had std prior outside of hiv (08/16/22 history) - negative triple screen and rpr 10/2021 RPR nonreactive 12/29/22 triple screen ordered     Follow-up: No follow-ups on file.   I have spent a total of 65 minutes of face-to-face and non-face-to-face time, excluding clinical staff time, preparing to see patient, ordering tests and/or medications, and provide counseling the patient   -------- Addendum Approved for cabenuva High dose loading ordered to [place] Discussed with pharmacy team  Jabier Mutton, [place] [facility] 12/29/2022, 4:21 PM

## 2022-12-30 ENCOUNTER — Other Ambulatory Visit (HOSPITAL_COMMUNITY): Payer: Self-pay

## 2022-12-30 LAB — T-HELPER CELL (CD4) - (RCID CLINIC ONLY)
CD4 % Helper T Cell: 37 % (ref 33–65)
CD4 T Cell Abs: 957 /uL (ref 400–1790)

## 2022-12-30 MED ORDER — CABOTEGRAVIR & RILPIVIRINE ER 600 & 900 MG/3ML IM SUER: 1.0000 | INTRAMUSCULAR | 1 refills | Status: AC

## 2022-12-30 NOTE — Addendum Note (Signed)
Addended byPrudencio Pair T on: 12/30/2022 11:57 AM   Modules accepted: Orders

## 2022-12-31 LAB — CBC WITH DIFFERENTIAL/PLATELET
Absolute Monocytes: 636 cells/uL (ref 200–950)
Basophils Absolute: 74 cells/uL (ref 0–200)
Basophils Relative: 1 %
Eosinophils Absolute: 111 cells/uL (ref 15–500)
Eosinophils Relative: 1.5 %
HCT: 41.6 % (ref 38.5–50.0)
Hemoglobin: 14.4 g/dL (ref 13.2–17.1)
Lymphs Abs: 2657 cells/uL (ref 850–3900)
MCH: 31.2 pg (ref 27.0–33.0)
MCHC: 34.6 g/dL (ref 32.0–36.0)
MCV: 90 fL (ref 80.0–100.0)
MPV: 10.5 fL (ref 7.5–12.5)
Monocytes Relative: 8.6 %
Neutro Abs: 3922 cells/uL (ref 1500–7800)
Neutrophils Relative %: 53 %
Platelets: 261 10*3/uL (ref 140–400)
RBC: 4.62 10*6/uL (ref 4.20–5.80)
RDW: 11.7 % (ref 11.0–15.0)
Total Lymphocyte: 35.9 %
WBC: 7.4 10*3/uL (ref 3.8–10.8)

## 2022-12-31 LAB — COMPLETE METABOLIC PANEL WITH GFR
AG Ratio: 1.5 (calc) (ref 1.0–2.5)
ALT: 19 U/L (ref 9–46)
AST: 17 U/L (ref 10–40)
Albumin: 4.3 g/dL (ref 3.6–5.1)
Alkaline phosphatase (APISO): 62 U/L (ref 36–130)
BUN: 7 mg/dL (ref 7–25)
CO2: 30 mmol/L (ref 20–32)
Calcium: 9 mg/dL (ref 8.6–10.3)
Chloride: 102 mmol/L (ref 98–110)
Creat: 1.01 mg/dL (ref 0.60–1.24)
Globulin: 2.8 g/dL (calc) (ref 1.9–3.7)
Glucose, Bld: 75 mg/dL (ref 65–99)
Potassium: 4.2 mmol/L (ref 3.5–5.3)
Sodium: 138 mmol/L (ref 135–146)
Total Bilirubin: 0.6 mg/dL (ref 0.2–1.2)
Total Protein: 7.1 g/dL (ref 6.1–8.1)
eGFR: 107 mL/min/{1.73_m2} (ref >=60)

## 2022-12-31 LAB — HIV-1 RNA QUANT-NO REFLEX-BLD
HIV 1 RNA Quant: NOT DETECTED Copies/mL
HIV-1 RNA Quant, Log: NOT DETECTED Log cps/mL

## 2023-01-02 LAB — CYTOLOGY, (ORAL, ANAL, URETHRAL) ANCILLARY ONLY
Chlamydia: NEGATIVE
Chlamydia: NEGATIVE
Comment: NEGATIVE
Comment: NEGATIVE
Comment: NORMAL
Comment: NORMAL
Neisseria Gonorrhea: NEGATIVE
Neisseria Gonorrhea: NEGATIVE

## 2023-01-02 LAB — URINE CYTOLOGY ANCILLARY ONLY
Chlamydia: NEGATIVE
Comment: NEGATIVE
Comment: NEGATIVE
Comment: NORMAL
Neisseria Gonorrhea: NEGATIVE
Trichomonas: NEGATIVE

## 2023-01-03 ENCOUNTER — Telehealth: Payer: Self-pay | Admitting: Pharmacist

## 2023-01-03 NOTE — Telephone Encounter (Signed)
Patient's repeat viral load remains undetectable. Per Dr. Gale Journey, would like to transition to [place]. LVM requesting callback to discuss medication change. Will wait to send prescriptions to [facility] in [place] until pharmacy speaks with patient.  Alfonse Spruce, PharmD, CPP, BCIDP, [place] Clinical Pharmacist Practitioner [facility]

## 2023-01-05 ENCOUNTER — Other Ambulatory Visit (HOSPITAL_COMMUNITY): Payer: Self-pay

## 2023-07-04 ENCOUNTER — Ambulatory Visit: Payer: Self-pay | Admitting: Internal Medicine

## 2024-07-09 ENCOUNTER — Telehealth: Payer: Self-pay

## 2024-07-09 NOTE — Telephone Encounter (Signed)
 Attempt to Re-Engage in Care  No office visit or HIV labs completed at [facility] within the last 12 months. Patient is considered out of care.   Last [facility] Visit: 12/29/22  Last HIV Viral Load:  HIV 1 RNA Quant  Date Value Ref Range Status  12/29/2022 Not Detected Copies/mL Final    Last CD4 Count:  CD4 T Cell Abs  Date Value Ref Range Status  12/29/2022 957 400 - 1,790 /uL Final    Medication Dispense History:   Dispensed Days Supply Quantity Provider Pharmacy  DOVATO  50-300MG  TABLETS 05/31/2024 30 30 each ADLEY,SHARON [id number]...  DOVATO  50-300MG  TABLETS 01/12/2024 30 30 each Malek, Alexandre E, MD [id number]...  DOVATO  50-300MG  TABLETS 12/14/2023 30 30 each Malek, Alexandre E, MD [id number]...    Interventions: Florence Bruckner, call could not be completed. Called alternated number in chart, voicemail belongs to Crystal. Did not leave message.  Per last office note, appears patient travels and moves often.   No updates in Care Everywhere. No labs available in [facility].  Dovato  was dispensed as recently as 05/31/24 -- prescribed by Reena Chol, dispensed in [place] . Other refills have come from ID provider Dr. Selma Goldberg in [place], [place].   Patient's care is considered transferred.   Duration of Services: 10 minutes  Annalyssa Thune, BSN, Charity fundraiser
